# Patient Record
Sex: Male | Born: 1980 | Race: Black or African American | Hispanic: No | Marital: Married | Smoking: Current every day smoker
Health system: Southern US, Community
[De-identification: ages and names within clinical notes are randomized; demographics above are authoritative.]

## PROBLEM LIST (undated history)

## (undated) DIAGNOSIS — I10 Essential (primary) hypertension: Secondary | ICD-10-CM

## (undated) DIAGNOSIS — K219 Gastro-esophageal reflux disease without esophagitis: Secondary | ICD-10-CM

---

## 2009-10-27 ENCOUNTER — Emergency Department (HOSPITAL_COMMUNITY): Admission: EM | Admit: 2009-10-27 | Discharge: 2009-10-27 | Payer: Self-pay | Admitting: Emergency Medicine

## 2010-08-16 LAB — HEPATIC FUNCTION PANEL
ALT: 34 U/L (ref 0–53)
Alkaline Phosphatase: 42 U/L (ref 39–117)
Bilirubin, Direct: <0.1 mg/dL (ref 0.0–0.3)
Total Bilirubin: 1.1 mg/dL (ref 0.3–1.2)

## 2010-08-16 LAB — LIPASE, BLOOD: Lipase: 25 U/L (ref 11–59)

## 2010-08-16 LAB — RAPID URINE DRUG SCREEN, HOSP PERFORMED
Barbiturates: POSITIVE — AB
Opiates: NOT DETECTED
Tetrahydrocannabinol: POSITIVE — AB

## 2010-08-16 LAB — BASIC METABOLIC PANEL
CO2: 20 mEq/L (ref 19–32)
GFR calc Af Amer: >60 mL/min (ref >60)
Glucose, Bld: 132 mg/dL — ABNORMAL HIGH (ref 70–99)
Potassium: 3.6 mEq/L (ref 3.5–5.1)
Sodium: 137 mEq/L (ref 135–145)

## 2010-08-16 LAB — DIFFERENTIAL
Basophils Absolute: 0 10*3/uL (ref 0.0–0.1)
Basophils Relative: 0 % (ref 0–1)
Eosinophils Relative: 0 % (ref 0–5)
Lymphocytes Relative: 10 % — ABNORMAL LOW (ref 12–46)
Lymphs Abs: 1.1 10*3/uL (ref 0.7–4.0)
Monocytes Absolute: 0.4 10*3/uL (ref 0.1–1.0)
Monocytes Relative: 4 % (ref 3–12)
Neutro Abs: 9.3 10*3/uL — ABNORMAL HIGH (ref 1.7–7.7)

## 2010-08-16 LAB — CBC
RBC: 4.89 MIL/uL (ref 4.22–5.81)
WBC: 10.8 10*3/uL — ABNORMAL HIGH (ref 4.0–10.5)

## 2010-08-16 LAB — POCT CARDIAC MARKERS
Myoglobin, poc: 245 ng/mL (ref 12–200)
Troponin i, poc: <0.05 ng/mL (ref 0.00–0.09)

## 2010-10-31 ENCOUNTER — Emergency Department (HOSPITAL_COMMUNITY)
Admission: EM | Admit: 2010-10-31 | Discharge: 2010-10-31 | Disposition: A | Discharge status: Home / Self Care | Payer: BC Managed Care – PPO | Attending: Emergency Medicine | Admitting: Emergency Medicine

## 2010-10-31 ENCOUNTER — Emergency Department (HOSPITAL_COMMUNITY): Payer: BC Managed Care – PPO

## 2010-10-31 DIAGNOSIS — R0602 Shortness of breath: Secondary | ICD-10-CM | POA: Insufficient documentation

## 2010-10-31 DIAGNOSIS — F17200 Nicotine dependence, unspecified, uncomplicated: Secondary | ICD-10-CM | POA: Insufficient documentation

## 2010-10-31 DIAGNOSIS — K219 Gastro-esophageal reflux disease without esophagitis: Secondary | ICD-10-CM | POA: Insufficient documentation

## 2010-10-31 DIAGNOSIS — R079 Chest pain, unspecified: Secondary | ICD-10-CM | POA: Insufficient documentation

## 2011-02-14 ENCOUNTER — Emergency Department (HOSPITAL_COMMUNITY)
Admission: EM | Admit: 2011-02-14 | Discharge: 2011-02-14 | Disposition: A | Discharge status: Home / Self Care | Payer: BC Managed Care – PPO | Attending: Emergency Medicine | Admitting: Emergency Medicine

## 2011-02-14 ENCOUNTER — Emergency Department (HOSPITAL_COMMUNITY): Payer: BC Managed Care – PPO

## 2011-02-14 DIAGNOSIS — F411 Generalized anxiety disorder: Secondary | ICD-10-CM | POA: Insufficient documentation

## 2011-02-14 DIAGNOSIS — R079 Chest pain, unspecified: Secondary | ICD-10-CM | POA: Insufficient documentation

## 2011-02-14 DIAGNOSIS — I10 Essential (primary) hypertension: Secondary | ICD-10-CM | POA: Insufficient documentation

## 2011-02-14 LAB — COMPREHENSIVE METABOLIC PANEL
Albumin: 4.4 g/dL (ref 3.5–5.2)
Alkaline Phosphatase: 54 U/L (ref 39–117)
CO2: 22 mEq/L (ref 19–32)
GFR calc Af Amer: >60 mL/min (ref >60)
GFR calc non Af Amer: >60 mL/min (ref >60)
Potassium: 3.8 mEq/L (ref 3.5–5.1)
Sodium: 137 mEq/L (ref 135–145)
Total Bilirubin: 0.3 mg/dL (ref 0.3–1.2)
Total Protein: 7.9 g/dL (ref 6.0–8.3)

## 2011-02-14 LAB — CBC
HCT: 46.6 % (ref 39.0–52.0)
Hemoglobin: 16.1 g/dL (ref 13.0–17.0)
MCV: 94.1 fL (ref 78.0–100.0)
Platelets: 277 10*3/uL (ref 150–400)

## 2011-02-14 LAB — D-DIMER, QUANTITATIVE: D-Dimer, Quant: 0.27 ug/mL-FEU (ref 0.00–0.48)

## 2011-02-14 LAB — POCT I-STAT, CHEM 8
Creatinine, Ser: 0.7 mg/dL (ref 0.50–1.35)
Glucose, Bld: 106 mg/dL — ABNORMAL HIGH (ref 70–99)
HCT: 46 % (ref 39.0–52.0)
TCO2: 23 mmol/L (ref 0–100)

## 2011-02-14 LAB — DIFFERENTIAL
Basophils Absolute: 0.1 10*3/uL (ref 0.0–0.1)
Basophils Relative: 1 % (ref 0–1)
Lymphocytes Relative: 18 % (ref 12–46)
Lymphs Abs: 2.1 10*3/uL (ref 0.7–4.0)
Monocytes Absolute: 0.4 10*3/uL (ref 0.1–1.0)
Monocytes Relative: 4 % (ref 3–12)
Neutro Abs: 8.7 10*3/uL — ABNORMAL HIGH (ref 1.7–7.7)

## 2011-02-14 LAB — POCT I-STAT TROPONIN I

## 2011-02-21 ENCOUNTER — Emergency Department (HOSPITAL_COMMUNITY)
Admission: EM | Admit: 2011-02-21 | Discharge: 2011-02-21 | Disposition: A | Discharge status: Home / Self Care | Payer: BC Managed Care – PPO | Attending: Emergency Medicine | Admitting: Emergency Medicine

## 2011-02-21 ENCOUNTER — Emergency Department (HOSPITAL_COMMUNITY): Payer: BC Managed Care – PPO

## 2011-02-21 DIAGNOSIS — F17200 Nicotine dependence, unspecified, uncomplicated: Secondary | ICD-10-CM | POA: Insufficient documentation

## 2011-02-21 DIAGNOSIS — I10 Essential (primary) hypertension: Secondary | ICD-10-CM | POA: Insufficient documentation

## 2011-02-21 DIAGNOSIS — R079 Chest pain, unspecified: Secondary | ICD-10-CM | POA: Insufficient documentation

## 2011-02-21 DIAGNOSIS — I498 Other specified cardiac arrhythmias: Secondary | ICD-10-CM | POA: Insufficient documentation

## 2011-02-21 LAB — POCT I-STAT, CHEM 8
Calcium, Ion: 1.12 mmol/L (ref 1.12–1.32)
HCT: 50 % (ref 39.0–52.0)
Hemoglobin: 17 g/dL (ref 13.0–17.0)
Potassium: 3.6 mEq/L (ref 3.5–5.1)
Sodium: 139 mEq/L (ref 135–145)
TCO2: 20 mmol/L (ref 0–100)

## 2011-06-26 ENCOUNTER — Emergency Department (HOSPITAL_COMMUNITY)
Admission: EM | Admit: 2011-06-26 | Discharge: 2011-06-26 | Disposition: A | Discharge status: Home / Self Care | Payer: BC Managed Care – PPO | Attending: Emergency Medicine | Admitting: Emergency Medicine

## 2011-06-26 ENCOUNTER — Other Ambulatory Visit: Payer: Self-pay

## 2011-06-26 ENCOUNTER — Encounter (HOSPITAL_COMMUNITY): Payer: Self-pay | Admitting: Emergency Medicine

## 2011-06-26 ENCOUNTER — Emergency Department (HOSPITAL_COMMUNITY): Payer: BC Managed Care – PPO

## 2011-06-26 DIAGNOSIS — R079 Chest pain, unspecified: Secondary | ICD-10-CM | POA: Insufficient documentation

## 2011-06-26 DIAGNOSIS — K219 Gastro-esophageal reflux disease without esophagitis: Secondary | ICD-10-CM | POA: Insufficient documentation

## 2011-06-26 HISTORY — DX: Gastro-esophageal reflux disease without esophagitis: K21.9

## 2011-06-26 HISTORY — DX: Essential (primary) hypertension: I10

## 2011-06-26 LAB — CBC
HCT: 45.8 % (ref 39.0–52.0)
Hemoglobin: 16.1 g/dL (ref 13.0–17.0)
MCH: 33.1 pg (ref 26.0–34.0)
MCHC: 35.2 g/dL (ref 30.0–36.0)
RBC: 4.87 MIL/uL (ref 4.22–5.81)

## 2011-06-26 LAB — COMPREHENSIVE METABOLIC PANEL
Albumin: 4.4 g/dL (ref 3.5–5.2)
Alkaline Phosphatase: 47 U/L (ref 39–117)
BUN: 10 mg/dL (ref 6–23)
Chloride: 103 mEq/L (ref 96–112)
Creatinine, Ser: 0.86 mg/dL (ref 0.50–1.35)
GFR calc Af Amer: >90 mL/min (ref >90)
Glucose, Bld: 109 mg/dL — ABNORMAL HIGH (ref 70–99)
Potassium: 4.4 mEq/L (ref 3.5–5.1)
Total Bilirubin: 0.3 mg/dL (ref 0.3–1.2)

## 2011-06-26 LAB — DIFFERENTIAL
Basophils Relative: 0 % (ref 0–1)
Eosinophils Absolute: 0 10*3/uL (ref 0.0–0.7)
Lymphs Abs: 1 10*3/uL (ref 0.7–4.0)
Monocytes Absolute: 0.4 10*3/uL (ref 0.1–1.0)
Monocytes Relative: 5 % (ref 3–12)
Neutro Abs: 6.9 10*3/uL (ref 1.7–7.7)

## 2011-06-26 LAB — D-DIMER, QUANTITATIVE: D-Dimer, Quant: <0.22 ug/mL-FEU (ref 0.00–0.48)

## 2011-06-26 LAB — POCT I-STAT TROPONIN I: Troponin i, poc: 0 ng/mL (ref 0.00–0.08)

## 2011-06-26 MED ORDER — KETOROLAC TROMETHAMINE 30 MG/ML IJ SOLN
30.0000 mg | Freq: Once | INTRAMUSCULAR | Status: AC
  Administered 2011-06-26: 30 mg via INTRAVENOUS
  Filled 2011-06-26: qty 1

## 2011-06-26 MED ORDER — PROMETHAZINE HCL 25 MG PO TABS: 25.0000 mg | ORAL_TABLET | Freq: Four times a day (QID) | ORAL | Status: DC | PRN

## 2011-06-26 MED ORDER — ONDANSETRON HCL 4 MG/2ML IJ SOLN
4.0000 mg | INTRAMUSCULAR | Status: DC | PRN
  Administered 2011-06-26: 4 mg via INTRAVENOUS
  Filled 2011-06-26: qty 2

## 2011-06-26 MED ORDER — GI COCKTAIL ~~LOC~~
30.0000 mL | Freq: Once | ORAL | Status: AC
  Administered 2011-06-26: 30 mL via ORAL
  Filled 2011-06-26: qty 30

## 2011-06-26 MED ORDER — HYDROMORPHONE HCL PF 1 MG/ML IJ SOLN
1.0000 mg | Freq: Once | INTRAMUSCULAR | Status: AC
  Administered 2011-06-26: 1 mg via INTRAMUSCULAR
  Filled 2011-06-26: qty 1

## 2011-06-26 MED ORDER — HYDROCODONE-ACETAMINOPHEN 5-325 MG PO TABS: ORAL_TABLET | ORAL | Status: AC

## 2011-06-26 MED ORDER — FAMOTIDINE IN NACL 20-0.9 MG/50ML-% IV SOLN
20.0000 mg | Freq: Once | INTRAVENOUS | Status: AC
  Administered 2011-06-26: 20 mg via INTRAVENOUS
  Filled 2011-06-26: qty 50

## 2011-06-26 MED ORDER — SODIUM CHLORIDE 0.9 % IV SOLN
INTRAVENOUS | Status: DC
  Administered 2011-06-26: 100 mL via INTRAVENOUS

## 2011-06-26 MED ORDER — ONDANSETRON 8 MG PO TBDP
8.0000 mg | ORAL_TABLET | Freq: Once | ORAL | Status: AC
  Administered 2011-06-26: 8 mg via ORAL
  Filled 2011-06-26: qty 1

## 2011-06-26 NOTE — ED Notes (Signed)
Pt has been calling out for pain medicines, Dr.McManus notified and will come to talk to the patient.

## 2011-06-26 NOTE — ED Notes (Signed)
Pt c/o Left side chest pain, sharp, 10/10, onset at 0530. Worsen with respiration. No radiation of pain, do have nausea and vomiting. No SOB noted. Good oxygen saturation on room air. Breath sound clear.  ABC intact. HR 60 sinus rhyme noted

## 2011-06-26 NOTE — ED Notes (Signed)
Pt states he developed left side chest pain approx 0530 this am. States that he has had chest pain in the past was diagnosed with Acid Reflux. Pt states that this pain is different though. Pt reports nausea, vomiting, and diaphoreses with the chest pain. Pt states pain is located on left chest wall and does not move elsewhere.

## 2011-06-26 NOTE — ED Provider Notes (Signed)
History     CSN: [id number]  Arrival date & time 06/26/11  1207   Chief Complaint  Patient presents with  . Chest Pain   HPI Pt was seen at 1230.  Per pt, c/o gradual onset and persistence of constant lower mid-sternal chest "pain" since 0530 this morning.  Discomfort began while he was laying down.  Has been assoc with N/V.  Pt describes his symptoms today as per his previous hx of GERD, as dx by GI MD via EGD.  Pt has had multiple ED visits for same in the past several years, most recently 4 mos ago.  Denies diarrhea, no black or blood in stools, no SOB/cough, no back pain, no radiation of pain, no rash, no fevers.    Past Medical History  Diagnosis Date  . GERD (gastroesophageal reflux disease)   . Hypertension     No past surgical history on file.   History  Substance Use Topics  . Smoking status: Current Everyday Smoker  . Smokeless tobacco: Not on file  . Alcohol Use: Yes    Review of Systems ROS: Statement: All systems negative except as marked or noted in the HPI; Constitutional: Negative for fever and chills. ; ; Eyes: Negative for eye pain, redness and discharge. ; ; ENMT: Negative for ear pain, hoarseness, nasal congestion, sinus pressure and sore throat. ; ; Cardiovascular: +CP.  Negative for palpitations, diaphoresis, dyspnea and peripheral edema. ; ; Respiratory: Negative for cough, wheezing and stridor. ; ; Gastrointestinal: +N/V.  Negative for diarrhea, abdominal pain, blood in stool, hematemesis, jaundice and rectal bleeding. . ; ; Genitourinary: Negative for dysuria, flank pain and hematuria. ; ; Musculoskeletal: Negative for back pain and neck pain. Negative for swelling and trauma.; ; Skin: Negative for pruritus, rash, abrasions, blisters, bruising and skin lesion.; ; Neuro: Negative for headache, lightheadedness and neck stiffness. Negative for weakness, altered level of consciousness , altered mental status, extremity weakness, paresthesias, involuntary movement,  seizure and syncope.     Allergies  Review of patient's allergies indicates no known allergies.  Home Medications   Current Outpatient Rx  Name Route Sig Dispense Refill  . HYOSCYAMINE SULFATE 0.125 MG SL SUBL Sublingual Place 0.125 mg under the tongue every 4 (four) hours as needed. For stomach spasms.    Marland Kitchen PANTOPRAZOLE SODIUM 40 MG PO TBEC Oral Take 40 mg by mouth daily.    . DAYQUIL PO Oral Take 2 capsules by mouth every 6 (six) hours as needed. For cold symptoms.      BP 145/83  Temp(Src) 97.5 F (36.4 C) (Oral)  Resp 16  SpO2 100%  Physical Exam 1235: Physical examination:  Nursing notes reviewed; Vital signs and O2 SAT reviewed;  Constitutional: Well developed, Well nourished, Well hydrated, Uncomfortable appearing; Head:  Normocephalic, atraumatic; Eyes: EOMI, PERRL, No scleral icterus; ENMT: Mouth and pharynx normal, Mucous membranes moist; Neck: Supple, Full range of motion, No lymphadenopathy; Cardiovascular: Regular rate and rhythm, No murmur, rub, or gallop; Respiratory: Breath sounds clear & equal bilaterally, No rales, rhonchi, wheezes, or rub, Normal respiratory effort/excursion; Chest: Nontender, Movement normal; Abdomen: Soft, Nontender, Nondistended, Normal bowel sounds; Genitourinary: No CVA tenderness; Extremities: Pulses normal, No tenderness, No edema, No calf edema or asymmetry.; Neuro: AA&Ox3, Major CN grossly intact.  No gross focal motor or sensory deficits in extremities.; Skin: Color normal, Warm, Dry, no rash.    ED Course  Procedures   MDM  MDM Reviewed: nursing note, previous chart and vitals Reviewed previous: ECG  and labs Interpretation: labs, ECG and x-ray    Date: 06/26/2011  Rate: 57  Rhythm: normal sinus rhythm  QRS Axis: normal  Intervals: normal  ST/T Wave abnormalities: normal, artifact  Conduction Disutrbances:none  Narrative Interpretation:   Old EKG Reviewed: unchanged; no significant changes from previous EKG dated  02/21/2011.  Results for orders placed during the hospital encounter of 06/26/11  CBC      Component Value Range   WBC 8.3  4.0 - 10.5 (K/uL)   RBC 4.87  4.22 - 5.81 (MIL/uL)   Hemoglobin 16.1  13.0 - 17.0 (g/dL)   HCT 16.1  09.6 - 04.5 (%)   MCV 94.0  78.0 - 100.0 (fL)   MCH 33.1  26.0 - 34.0 (pg)   MCHC 35.2  30.0 - 36.0 (g/dL)   RDW 40.9  81.1 - 91.4 (%)   Platelets 233  150 - 400 (K/uL)  DIFFERENTIAL      Component Value Range   Neutrophils Relative 83 (*) 43 - 77 (%)   Neutro Abs 6.9  1.7 - 7.7 (K/uL)   Lymphocytes Relative 12  12 - 46 (%)   Lymphs Abs 1.0  0.7 - 4.0 (K/uL)   Monocytes Relative 5  3 - 12 (%)   Monocytes Absolute 0.4  0.1 - 1.0 (K/uL)   Eosinophils Relative 0  0 - 5 (%)   Eosinophils Absolute 0.0  0.0 - 0.7 (K/uL)   Basophils Relative 0  0 - 1 (%)   Basophils Absolute 0.0  0.0 - 0.1 (K/uL)  COMPREHENSIVE METABOLIC PANEL      Component Value Range   Sodium 138  135 - 145 (mEq/L)   Potassium 4.4  3.5 - 5.1 (mEq/L)   Chloride 103  96 - 112 (mEq/L)   CO2 29  19 - 32 (mEq/L)   Glucose, Bld 109 (*) 70 - 99 (mg/dL)   BUN 10  6 - 23 (mg/dL)   Creatinine, Ser 7.82  0.50 - 1.35 (mg/dL)   Calcium 9.8  8.4 - 95.6 (mg/dL)   Total Protein 7.5  6.0 - 8.3 (g/dL)   Albumin 4.4  3.5 - 5.2 (g/dL)   AST 17  0 - 37 (U/L)   ALT 24  0 - 53 (U/L)   Alkaline Phosphatase 47  39 - 117 (U/L)   Total Bilirubin 0.3  0.3 - 1.2 (mg/dL)   GFR calc non Af Amer >90  >90 (mL/min)   GFR calc Af Amer >90  >90 (mL/min)  D-DIMER, QUANTITATIVE      Component Value Range   D-Dimer, Quant <0.22  0.00 - 0.48 (ug/mL-FEU)  POCT I-STAT TROPONIN I      Component Value Range   Troponin i, poc 0.00  0.00 - 0.08 (ng/mL)   Comment 3             Dg Chest 2 View 06/26/2011  *RADIOLOGY REPORT*  Clinical Data: Rule out infiltrate.  Look for free air.  CHEST - 2 VIEW  Comparison: 02/21/2011  Findings: Heart size appears normal.  No pleural effusion or pulmonary edema.  No airspace consolidation.   Review of the visualized osseous structures is normal.  IMPRESSION:  1.  No active cardiopulmonary abnormalities.  Original Report Authenticated By: Rosealee Albee, M.D.     1505:   Pt requesting "some more medicine" for pain and nausea.  Pt requesting "that medicine they gave me last time I was here. That 'd' medicine."  (IV Dilaudid on EPIC  chart review.)  Doubt PE at this time given neg d-dimer, doubt ACS with unchanged/normal EKG from previous and normal troponin after 8+ hours of symptoms.  Has been eval in ED previously for same, dx chest wall pain (reported lifting boxes at work), as well as GERD.  Pt's wife states he "had that camera put down into his stomach" and "was told everything was ok" so he was started on PPI and bentyl.  Pt's VSS/afebrile today, no abd pain, no radiation of pain, no back pain.  Doubt need for CT scan at this time, as labs and CXR are normal.  Dx testing d/w pt and family.  Questions answered.  Verb understanding, agreeable to d/c home with outpt f/u.  Encouraged to f/u with is PMD and GI MD.  Verb understanding.         Keamber Macfadden Allison Quarry, DO 06/28/11 1154

## 2011-06-26 NOTE — ED Notes (Signed)
EDP went to room, assessed the patient.

## 2015-03-14 ENCOUNTER — Encounter (HOSPITAL_COMMUNITY): Payer: Self-pay | Admitting: Emergency Medicine

## 2015-03-14 ENCOUNTER — Emergency Department (HOSPITAL_COMMUNITY): Payer: Self-pay

## 2015-03-14 ENCOUNTER — Emergency Department (HOSPITAL_COMMUNITY)
Admission: EM | Admit: 2015-03-14 | Discharge: 2015-03-14 | Disposition: A | Discharge status: Home / Self Care | Payer: Self-pay | Attending: Emergency Medicine | Admitting: Emergency Medicine

## 2015-03-14 DIAGNOSIS — Z720 Tobacco use: Secondary | ICD-10-CM | POA: Insufficient documentation

## 2015-03-14 DIAGNOSIS — R079 Chest pain, unspecified: Secondary | ICD-10-CM | POA: Insufficient documentation

## 2015-03-14 DIAGNOSIS — I10 Essential (primary) hypertension: Secondary | ICD-10-CM | POA: Insufficient documentation

## 2015-03-14 DIAGNOSIS — K219 Gastro-esophageal reflux disease without esophagitis: Secondary | ICD-10-CM | POA: Insufficient documentation

## 2015-03-14 LAB — CBC WITH DIFFERENTIAL/PLATELET
BASOS ABS: 0 10*3/uL (ref 0.0–0.1)
BASOS PCT: 0 %
EOS PCT: 1 %
Eosinophils Absolute: 0.2 10*3/uL (ref 0.0–0.7)
HEMATOCRIT: 49 % (ref 39.0–52.0)
Hemoglobin: 17.2 g/dL — ABNORMAL HIGH (ref 13.0–17.0)
Lymphocytes Relative: 13 %
Lymphs Abs: 1.5 10*3/uL (ref 0.7–4.0)
MCH: 33.8 pg (ref 26.0–34.0)
MCHC: 35.1 g/dL (ref 30.0–36.0)
MCV: 96.3 fL (ref 78.0–100.0)
MONO ABS: 0.5 10*3/uL (ref 0.1–1.0)
Monocytes Relative: 5 %
Neutro Abs: 9.4 10*3/uL — ABNORMAL HIGH (ref 1.7–7.7)
Neutrophils Relative %: 81 %
Platelets: 258 10*3/uL (ref 150–400)
RBC: 5.09 MIL/uL (ref 4.22–5.81)
RDW: 13 % (ref 11.5–15.5)
WBC: 11.6 10*3/uL — ABNORMAL HIGH (ref 4.0–10.5)

## 2015-03-14 LAB — BASIC METABOLIC PANEL
Anion gap: 9 (ref 5–15)
BUN: 11 mg/dL (ref 6–20)
CHLORIDE: 103 mmol/L (ref 101–111)
CO2: 27 mmol/L (ref 22–32)
Calcium: 9.8 mg/dL (ref 8.9–10.3)
Creatinine, Ser: 1.02 mg/dL (ref 0.61–1.24)
GFR calc Af Amer: >60 mL/min (ref >60)
GFR calc non Af Amer: >60 mL/min (ref >60)
Glucose, Bld: 129 mg/dL — ABNORMAL HIGH (ref 65–99)
POTASSIUM: 4.1 mmol/L (ref 3.5–5.1)
SODIUM: 139 mmol/L (ref 135–145)

## 2015-03-14 LAB — I-STAT TROPONIN, ED: TROPONIN I, POC: 0 ng/mL (ref 0.00–0.08)

## 2015-03-14 MED ORDER — ONDANSETRON HCL 4 MG/2ML IJ SOLN
4.0000 mg | Freq: Once | INTRAMUSCULAR | Status: AC
  Administered 2015-03-14: 4 mg via INTRAVENOUS
  Filled 2015-03-14: qty 2

## 2015-03-14 MED ORDER — PANTOPRAZOLE SODIUM 20 MG PO TBEC: 20.0000 mg | DELAYED_RELEASE_TABLET | Freq: Every day | ORAL | Status: DC

## 2015-03-14 MED ORDER — GI COCKTAIL ~~LOC~~
30.0000 mL | Freq: Once | ORAL | Status: AC
  Administered 2015-03-14: 30 mL via ORAL
  Filled 2015-03-14: qty 30

## 2015-03-14 MED ORDER — FAMOTIDINE IN NACL 20-0.9 MG/50ML-% IV SOLN
20.0000 mg | Freq: Once | INTRAVENOUS | Status: AC
  Administered 2015-03-14: 20 mg via INTRAVENOUS
  Filled 2015-03-14: qty 50

## 2015-03-14 MED ORDER — HYDROCODONE-ACETAMINOPHEN 5-325 MG PO TABS
1.0000 | ORAL_TABLET | Freq: Once | ORAL | Status: AC
  Administered 2015-03-14: 1 via ORAL
  Filled 2015-03-14: qty 1

## 2015-03-14 NOTE — ED Notes (Signed)
He tells me his "chest is hurting" and that it began this morning and that it is new.  He denies drinking/smoking or drug use.  He is writhing as if in much pain and is minimally verbal.  EKG performed at triage.

## 2015-03-14 NOTE — ED Notes (Signed)
Patient transported to X-ray 

## 2015-03-14 NOTE — ED Notes (Signed)
Patient  States that he was awakened from his sleep with pressure and sharp pains to his midchest. It is only relieved when he holds his breath. He denies any issues prior to going to bed. He denies any drug use. Patient has had one episode similar before but does not remember what the diagnosis was.

## 2015-03-14 NOTE — ED Provider Notes (Signed)
CSN: [id number]     Arrival date & time 03/14/15  [id number] History   First MD Initiated Contact with Patient 03/14/15 0804     Chief Complaint  Patient presents with  . Chest Pain     (Consider location/radiation/quality/duration/timing/severity/associated sxs/prior Treatment) HPI 34 year old male with history of GERD who presents the emergency department with chest pain. Says that he woke at 3 AM this morning with retrosternal chest pressure and pain. Says he has had similar symptoms as this a few years ago, which she does not recall what it was due to. Pain is not radiating. Denies any associating shortness of breath, diaphoresis, syncope or near syncope, cough, fever, or other recent URI symptoms. There is associating nausea, but denies vomiting, abdominal pain, diarrhea, or urinary complaints. Reports that pain does seem to worsen with deep breaths, denies that pain is worse with exertion. Denies that pain is reproducible. Reports having [employer] last night to eat just prior to going to bed. Denies leg swelling, leg pain, h/o or family h/o DVT/PE, or recent immobolization.    Past Medical History  Diagnosis Date  . GERD (gastroesophageal reflux disease)   . Hypertension    History reviewed. No pertinent past surgical history. History reviewed. No pertinent family history. Social History  Substance Use Topics  . Smoking status: Current Every Day Smoker  . Smokeless tobacco: None  . Alcohol Use: Yes    Review of Systems 10/14 systems reviewed and are negative other than those stated in the HPI    Allergies  Review of patient's allergies indicates no known allergies.  Home Medications   Prior to Admission medications   Medication Sig Start Date End Date Taking? Authorizing Provider  pantoprazole (PROTONIX) 20 MG tablet Take 1 tablet (20 mg total) by mouth daily. 03/14/15   Lavera Guiseana Duo Rumaysa Sabatino, MD   BP 131/91 mmHg  Pulse 70  Temp(Src) 98.1 F (36.7 C) (Oral)  Resp 21  SpO2  100% Physical Exam Physical Exam  Nursing note and vitals reviewed. Constitutional: Well developed, well nourished, non-toxic Head: Normocephalic and atraumatic.  Mouth/Throat: Oropharynx is clear and moist.  Neck: Normal range of motion. Neck supple.  Cardiovascular: Normal rate and regular rhythm.  No edema. +2 symmetric radial pulse Pulmonary/Chest: Effort normal and breath sounds normal.  Abdominal: Soft. There is no tenderness. There is no rebound and no guarding.  Musculoskeletal: Normal range of motion.  Neurological: Alert, no facial droop, fluent speech, moves all extremities symmetrically Skin: Skin is warm and dry.  Psychiatric: Cooperative   ED Course  Procedures (including critical care time) Labs Review Labs Reviewed  CBC WITH DIFFERENTIAL/PLATELET - Abnormal; Notable for the following:    WBC 11.6 (*)    Hemoglobin 17.2 (*)    Neutro Abs 9.4 (*)    All other components within normal limits  BASIC METABOLIC PANEL - Abnormal; Notable for the following:    Glucose, Bld 129 (*)    All other components within normal limits  I-STAT TROPOININ, ED    Imaging Review Dg Chest 2 View  03/14/2015  CLINICAL DATA:  Mid chest pain x6 hours EXAM: CHEST  2 VIEW COMPARISON:  11/06/2012 FINDINGS: Lungs are clear.  No pleural effusion or pneumothorax. The heart is normal in size. Visualized osseous structures are within normal limits. IMPRESSION: Normal chest radiographs. Electronically Signed   By: Charline BillsSriyesh  Krishnan M.D.   On: 03/14/2015 09:10   I have personally reviewed and evaluated these images and lab results as part  of my medical decision-making.   EKG Interpretation   Date/Time:  Saturday March 14 2015 08:03:04 EDT Ventricular Rate:  83 PR Interval:  150 QRS Duration: 75 QT Interval:  358 QTC Calculation: 421 R Axis:   86 Text Interpretation:  Sinus rhythm Consider right atrial enlargement  Benign early repolarization No significant change since last tracing   Confirmed by Minervia Osso MD, Ladonya Jerkins ([id number]) on 03/14/2015 8:08:36 AM      MDM   Final diagnoses:  Chest pain, unspecified chest pain type  Gastroesophageal reflux disease, esophagitis presence not specified    34 year old male with history of GERD who presents with chest pain. Prior to my evaluation of patient, the nurses in triage tech reports that he was writhing about in bed, yelling for pain medication. On my evaluation of this patient, I he is nontoxic in appearance. States that he needs pain medication for his chest pain. His vital signs are within normal limits, and exam is otherwise unremarkable. EKG showing no ischemic changes and unchanged from prior. Troponin x 1 negative, and nature of pain does not seem ACS related. Especially given ongoing pain for 6 hours with negative troponin. On chart review, patient has EGD diagnosis of GERD, but states that he self discontinued his pantoprazole. He has had prior episodes of chest pain which she has been seen in the ED before similar in nature associated with GERD. PERC negative and felt ruled out for PE. Heart score 2 for tobacco use and possible HTN, and at low risk for MACE. Given GI cocktail, pepcid, and zofran in ED with some improvement. Restarted patient's home protonix. Strict return and follow-up instructions reviewed. He expressed understanding of all discharge instructions and felt comfortable with the plan of care.   Lavera Guise, MD 03/14/15 [phone]

## 2015-03-14 NOTE — ED Notes (Signed)
MD at bedside. 

## 2015-03-14 NOTE — Discharge Instructions (Signed)
Please start taking your protonix again. Return for worsening symptoms, including worsening pain, difficulty breathing, passing out, blooy or black tarry vomit or stools, or any other symptoms concerning to you. Nonspecific Chest Pain  Chest pain can be caused by many different conditions. There is always a chance that your pain could be related to something serious, such as a heart attack or a blood clot in your lungs. Chest pain can also be caused by conditions that are not life-threatening. If you have chest pain, it is very important to follow up with your health care provider. CAUSES  Chest pain can be caused by:  Heartburn.  Pneumonia or bronchitis.  Anxiety or stress.  Inflammation around your heart (pericarditis) or lung (pleuritis or pleurisy).  A blood clot in your lung.  A collapsed lung (pneumothorax). It can develop suddenly on its own (spontaneous pneumothorax) or from trauma to the chest.  Shingles infection (varicella-zoster virus).  Heart attack.  Damage to the bones, muscles, and cartilage that make up your chest wall. This can include:  Bruised bones due to injury.  Strained muscles or cartilage due to frequent or repeated coughing or overwork.  Fracture to one or more ribs.  Sore cartilage due to inflammation (costochondritis). RISK FACTORS  Risk factors for chest pain may include:  Activities that increase your risk for trauma or injury to your chest.  Respiratory infections or conditions that cause frequent coughing.  Medical conditions or overeating that can cause heartburn.  Heart disease or family history of heart disease.  Conditions or health behaviors that increase your risk of developing a blood clot.  Having had chicken pox (varicella zoster). SIGNS AND SYMPTOMS Chest pain can feel like:  Burning or tingling on the surface of your chest or deep in your chest.  Crushing, pressure, aching, or squeezing pain.  Dull or sharp pain that is  worse when you move, cough, or take a deep breath.  Pain that is also felt in your back, neck, shoulder, or arm, or pain that spreads to any of these areas. Your chest pain may come and go, or it may stay constant. DIAGNOSIS Lab tests or other studies may be needed to find the cause of your pain. Your health care provider may have you take a test called an ambulatory ECG (electrocardiogram). An ECG records your heartbeat patterns at the time the test is performed. You may also have other tests, such as:  Transthoracic echocardiogram (TTE). During echocardiography, sound waves are used to create a picture of all of the heart structures and to look at how blood flows through your heart.  Transesophageal echocardiogram (TEE).This is a more advanced imaging test that obtains images from inside your body. It allows your health care provider to see your heart in finer detail.  Cardiac monitoring. This allows your health care provider to monitor your heart rate and rhythm in real time.  Holter monitor. This is a portable device that records your heartbeat and can help to diagnose abnormal heartbeats. It allows your health care provider to track your heart activity for several days, if needed.  Stress tests. These can be done through exercise or by taking medicine that makes your heart beat more quickly.  Blood tests.  Imaging tests. TREATMENT  Your treatment depends on what is causing your chest pain. Treatment may include:  Medicines. These may include:  Acid blockers for heartburn.  Anti-inflammatory medicine.  Pain medicine for inflammatory conditions.  Antibiotic medicine, if an infection is present.  Medicines to dissolve blood clots.  Medicines to treat coronary artery disease.  Supportive care for conditions that do not require medicines. This may include:  Resting.  Applying heat or cold packs to injured areas.  Limiting activities until pain decreases. HOME CARE  INSTRUCTIONS  If you were prescribed an antibiotic medicine, finish it all even if you start to feel better.  Avoid any activities that bring on chest pain.  Do not use any tobacco products, including cigarettes, chewing tobacco, or electronic cigarettes. If you need help quitting, ask your health care provider.  Do not drink alcohol.  Take medicines only as directed by your health care provider.  Keep all follow-up visits as directed by your health care provider. This is important. This includes any further testing if your chest pain does not go away.  If heartburn is the cause for your chest pain, you may be told to keep your head raised (elevated) while sleeping. This reduces the chance that acid will go from your stomach into your esophagus.  Make lifestyle changes as directed by your health care provider. These may include:  Getting regular exercise. Ask your health care provider to suggest some activities that are safe for you.  Eating a heart-healthy diet. A registered dietitian can help you to learn healthy eating options.  Maintaining a healthy weight.  Managing diabetes, if necessary.  Reducing stress. SEEK MEDICAL CARE IF:  Your chest pain does not go away after treatment.  You have a rash with blisters on your chest.  You have a fever. SEEK IMMEDIATE MEDICAL CARE IF:   Your chest pain is worse.  You have an increasing cough, or you cough up blood.  You have severe abdominal pain.  You have severe weakness.  You faint.  You have chills.  You have sudden, unexplained chest discomfort.  You have sudden, unexplained discomfort in your arms, back, neck, or jaw.  You have shortness of breath at any time.  You suddenly start to sweat, or your skin gets clammy.  You feel nauseous or you vomit.  You suddenly feel light-headed or dizzy.  Your heart begins to beat quickly, or it feels like it is skipping beats. These symptoms may represent a serious  problem that is an emergency. Do not wait to see if the symptoms will go away. Get medical help right away. Call your local emergency services (911 in the [place] Do not drive yourself to the hospital.   This information is not intended to replace advice given to you by your health care provider. Make sure you discuss any questions you have with your health care provider.   Document Released: 02/23/2005 Document Revised: 06/06/2014 Document Reviewed: 12/20/2013 Elsevier Interactive Patient Education 2016 Else[employer] Inc[employer]   Gastroesophageal Reflux Disease, Adult Normally, food travels down the esophagus and stays in the stomach to be digested. If a person has gastroesophageal reflux disease (GERD), food and stomach acid move back up into the esophagus. When this happens, the esophagus becomes sore and swollen (inflamed). Over time, GERD can make small holes (ulcers) in the lining of the esophagus. HOME CARE Diet  Follow a diet as told by your doctor. You may need to avoid foods and drinks such as:  Coffee and tea (with or without caffeine).  Drinks that contain alcohol.  Energy drinks and sports drinks.  Carbonated drinks or sodas.  Chocolate and cocoa.  Peppermint and mint flavorings.  Garlic and onions.  Horseradish.  Spicy and acidic foods, such as  peppers, chili powder, curry powder, vinegar, hot sauces, and BBQ sauce.  Citrus fruit juices and citrus fruits, such as oranges, lemons, and limes.  Tomato-based foods, such as red sauce, chili, salsa, and pizza with red sauce.  Fried and fatty foods, such as donuts, french fries, potato chips, and high-fat dressings.  High-fat meats, such as hot dogs, rib eye steak, sausage, ham, and bacon.  High-fat dairy items, such as whole milk, butter, and cream cheese.  Eat small meals often. Avoid eating large meals.  Avoid drinking large amounts of liquid with your meals.  Avoid eating meals during the 2-3 hours before  bedtime.  Avoid lying down right after you eat.  Do not exercise right after you eat. General Instructions  Pay attention to any changes in your symptoms.  Take over-the-counter and prescription medicines only as told by your doctor. Do not take aspirin, ibuprofen, or other NSAIDs unless your doctor says it is okay.  Do not use any tobacco products, including cigarettes, chewing tobacco, and e-cigarettes. If you need help quitting, ask your doctor.  Wear loose clothes. Do not wear anything tight around your waist.  Raise (elevate) the head of your bed about 6 inches (15 cm).  Try to lower your stress. If you need help doing this, ask your doctor.  If you are overweight, lose an amount of weight that is healthy for you. Ask your doctor about a safe weight loss goal.  Keep all follow-up visits as told by your doctor. This is important. GET HELP IF:  You have new symptoms.  You lose weight and you do not know why it is happening.  You have trouble swallowing, or it hurts to swallow.  You have wheezing or a cough that keeps happening.  Your symptoms do not get better with treatment.  You have a hoarse voice. GET HELP RIGHT AWAY IF:  You have pain in your arms, neck, jaw, teeth, or back.  You feel sweaty, dizzy, or light-headed.  You have chest pain or shortness of breath.  You throw up (vomit) and your throw up looks like blood or coffee grounds.  You pass out (faint).  Your poop (stool) is bloody or black.  You cannot swallow, drink, or eat.   This information is not intended to replace advice given to you by your health care provider. Make sure you discuss any questions you have with your health care provider.   Document Released: 11/02/2007 Document Revised: 02/04/2015 Document Reviewed: 09/10/2014 Elsevier Interactive Patient Education 2016 Elsevier Inc.   Heartburn Heartburn is a type of pain or discomfort that can happen in the throat or chest. It is  often described as a burning pain. It may also cause a bad taste in the mouth. Heartburn may feel worse when you lie down or bend over. It may be caused by stomach contents that move back up (reflux) into the tube that connects the mouth with the stomach (esophagus). HOME CARE Take these actions to lessen your discomfort and to help avoid problems. Diet  Follow a diet as told by your doctor. You may need to avoid foods and drinks such as:  Coffee and tea (with or without caffeine).  Drinks that contain alcohol.  Energy drinks and sports drinks.  Carbonated drinks or sodas.  Chocolate and cocoa.  Peppermint and mint flavorings.  Garlic and onions.  Horseradish.  Spicy and acidic foods, such as peppers, chili powder, curry powder, vinegar, hot sauces, and BBQ sauce.  Citrus fruit  juices and citrus fruits, such as oranges, lemons, and limes.  Tomato-based foods, such as red sauce, chili, salsa, and pizza with red sauce.  Fried and fatty foods, such as donuts, french fries, potato chips, and high-fat dressings.  High-fat meats, such as hot dogs, rib eye steak, sausage, ham, and bacon.  High-fat dairy items, such as whole milk, butter, and cream cheese.  Eat small meals often. Avoid eating large meals.  Avoid drinking large amounts of liquid with your meals.  Avoid eating meals during the 2-3 hours before bedtime.  Avoid lying down right after you eat.  Do not exercise right after you eat. General Instructions  Pay attention to any changes in your symptoms.  Take over-the-counter and prescription medicines only as told by your doctor. Do not take aspirin, ibuprofen, or other NSAIDs unless your doctor says it is okay.  Do not use any tobacco products, including cigarettes, chewing tobacco, and e-cigarettes. If you need help quitting, ask your doctor.  Wear loose clothes. Do not wear anything tight around your waist.  Raise (elevate) the head of your bed about 6 inches  (15 cm).  Try to lower your stress. If you need help doing this, ask your doctor.  If you are overweight, lose an amount of weight that is healthy for you. Ask your doctor about a safe weight loss goal.  Keep all follow-up visits as told by your doctor. This is important. GET HELP IF:  You have new symptoms.  You lose weight and you do not know why it is happening.  You have trouble swallowing, or it hurts to swallow.  You have wheezing or a cough that keeps happening.  Your symptoms do not get better with treatment.  You have heartburn often for more than two weeks. GET HELP RIGHT AWAY IF:  You have pain in your arms, neck, jaw, teeth, or back.  You feel sweaty, dizzy, or light-headed.  You have chest pain or shortness of breath.  You throw up (vomit) and your throw up looks like blood or coffee grounds.  Your poop (stool) is bloody or black.   This information is not intended to replace advice given to you by your health care provider. Make sure you discuss any questions you have with your health care provider.   Document Released: 01/26/2011 Document Revised: 02/04/2015 Document Reviewed: 09/10/2014 Elsevier Interactive Patient Education [employer]

## 2015-03-16 ENCOUNTER — Emergency Department (HOSPITAL_COMMUNITY): Payer: Self-pay

## 2015-03-16 ENCOUNTER — Emergency Department (HOSPITAL_COMMUNITY)
Admission: EM | Admit: 2015-03-16 | Discharge: 2015-03-16 | Disposition: A | Discharge status: Home / Self Care | Payer: Self-pay | Attending: Emergency Medicine | Admitting: Emergency Medicine

## 2015-03-16 ENCOUNTER — Encounter (HOSPITAL_COMMUNITY): Payer: Self-pay | Admitting: General Practice

## 2015-03-16 DIAGNOSIS — K219 Gastro-esophageal reflux disease without esophagitis: Secondary | ICD-10-CM | POA: Insufficient documentation

## 2015-03-16 DIAGNOSIS — Z720 Tobacco use: Secondary | ICD-10-CM | POA: Insufficient documentation

## 2015-03-16 DIAGNOSIS — I10 Essential (primary) hypertension: Secondary | ICD-10-CM | POA: Insufficient documentation

## 2015-03-16 DIAGNOSIS — R079 Chest pain, unspecified: Secondary | ICD-10-CM | POA: Insufficient documentation

## 2015-03-16 DIAGNOSIS — R1013 Epigastric pain: Secondary | ICD-10-CM

## 2015-03-16 LAB — CBC
HCT: 47.5 % (ref 39.0–52.0)
Hemoglobin: 16.7 g/dL (ref 13.0–17.0)
MCH: 33.1 pg (ref 26.0–34.0)
MCHC: 35.2 g/dL (ref 30.0–36.0)
MCV: 94.1 fL (ref 78.0–100.0)
Platelets: 294 10*3/uL (ref 150–400)
RBC: 5.05 MIL/uL (ref 4.22–5.81)
RDW: 12.4 % (ref 11.5–15.5)
WBC: 10.5 10*3/uL (ref 4.0–10.5)

## 2015-03-16 LAB — BASIC METABOLIC PANEL
ANION GAP: 15 (ref 5–15)
BUN: 12 mg/dL (ref 6–20)
CALCIUM: 9.8 mg/dL (ref 8.9–10.3)
CO2: 23 mmol/L (ref 22–32)
Chloride: 101 mmol/L (ref 101–111)
Creatinine, Ser: 1.08 mg/dL (ref 0.61–1.24)
GFR calc Af Amer: >60 mL/min (ref >60)
GLUCOSE: 143 mg/dL — AB (ref 65–99)
Potassium: 3.5 mmol/L (ref 3.5–5.1)
Sodium: 139 mmol/L (ref 135–145)

## 2015-03-16 LAB — I-STAT TROPONIN, ED: Troponin i, poc: 0 ng/mL (ref 0.00–0.08)

## 2015-03-16 LAB — HEPATIC FUNCTION PANEL
ALT: 29 U/L (ref 17–63)
AST: 38 U/L (ref 15–41)
Albumin: 4.6 g/dL (ref 3.5–5.0)
Alkaline Phosphatase: 41 U/L (ref 38–126)
BILIRUBIN DIRECT: 0.2 mg/dL (ref 0.1–0.5)
BILIRUBIN INDIRECT: 0.8 mg/dL (ref 0.3–0.9)
Total Bilirubin: 1 mg/dL (ref 0.3–1.2)
Total Protein: 7.9 g/dL (ref 6.5–8.1)

## 2015-03-16 LAB — LIPASE, BLOOD: Lipase: 20 U/L — ABNORMAL LOW (ref 22–51)

## 2015-03-16 MED ORDER — PANTOPRAZOLE SODIUM 40 MG IV SOLR
40.0000 mg | INTRAVENOUS | Status: AC
  Administered 2015-03-16: 40 mg via INTRAVENOUS
  Filled 2015-03-16: qty 40

## 2015-03-16 MED ORDER — METOCLOPRAMIDE HCL 5 MG/ML IJ SOLN
10.0000 mg | Freq: Once | INTRAMUSCULAR | Status: AC
  Administered 2015-03-16: 10 mg via INTRAVENOUS
  Filled 2015-03-16: qty 2

## 2015-03-16 MED ORDER — ONDANSETRON 4 MG PO TBDP: 4.0000 mg | ORAL_TABLET | Freq: Three times a day (TID) | ORAL | Status: DC | PRN

## 2015-03-16 MED ORDER — MORPHINE SULFATE (PF) 4 MG/ML IV SOLN
4.0000 mg | Freq: Once | INTRAVENOUS | Status: AC
  Administered 2015-03-16: 4 mg via INTRAVENOUS
  Filled 2015-03-16: qty 1

## 2015-03-16 NOTE — ED Provider Notes (Signed)
CSN: [id number]     Arrival date & time 03/16/15  1227 History   First MD Initiated Contact with Patient 03/16/15 1232     Chief Complaint  Patient presents with  . Chest Pain  . Abdominal Pain     (Consider location/radiation/quality/duration/timing/severity/associated sxs/prior Treatment) HPI Comments: Pt is a 34 yo male with hx of GERD who presents to the ED with complaint of midsternal/epigastric CP. Pt reports the CP started Saturday morning, was seen in the ED, cardiac workup negative, mild improvement with GI cocktail, pepcid and zofran in ED, d/c home with protonix. Pt reports the pain improved on Sunday and this morning when he woke up he had worsening pain resulting in him going to his PCP. PCP gave pt zofran and GI cocktail in office without relief, resulting in pt being sent to ED via EMS. EMS administered ASA and 2 nitro without relief. Pt reports having N/V today. Denies aggravating or alleviating factors. Denies fever, chills, HA, cough, SOB, palpitations, diaphoresis, URI sxs, diarrhea, urinary sxs, leg swelling. Pt reports he has not been taking his pantoprazole. Pt endorses drinking EtOH frequently.    Past Medical History  Diagnosis Date  . GERD (gastroesophageal reflux disease)   . Hypertension    History reviewed. No pertinent past surgical history. No family history on file. Social History  Substance Use Topics  . Smoking status: Current Every Day Smoker -- 1.00 packs/day    Types: Cigarettes  . Smokeless tobacco: None  . Alcohol Use: Yes    Review of Systems  Cardiovascular: Positive for chest pain.  Gastrointestinal: Positive for nausea, vomiting and abdominal pain.  All other systems reviewed and are negative.     Allergies  Review of patient's allergies indicates no known allergies.  Home Medications   Prior to Admission medications   Medication Sig Start Date End Date Taking? Authorizing Provider  pantoprazole (PROTONIX) 20 MG tablet Take 1  tablet (20 mg total) by mouth daily. 03/14/15   Lavera Guise, MD   BP 142/75 mmHg  Pulse 104  Temp(Src) 98.2 F (36.8 C) (Oral)  Resp 27  Ht  (1.727 m)  Wt 179 lb (81.194 kg)  BMI 27.22 kg/m2  SpO2 100% Physical Exam  Constitutional: He is oriented to person, place, and time. He appears well-developed and well-nourished.  HENT:  Head: Normocephalic and atraumatic.  Mouth/Throat: Oropharynx is clear and moist. No oropharyngeal exudate.  Eyes: Conjunctivae and EOM are normal. Right eye exhibits no discharge. Left eye exhibits no discharge. No scleral icterus.  Neck: Normal range of motion. Neck supple.  Cardiovascular: Normal rate, regular rhythm, normal heart sounds and intact distal pulses.  Exam reveals no gallop and no friction rub.   No murmur heard. Pulmonary/Chest: Effort normal and breath sounds normal. No respiratory distress. He has no wheezes. He has no rales. He exhibits tenderness (lower mid-sternal chest wall mildly TTP).  Abdominal: Soft. Bowel sounds are normal. He exhibits no distension and no mass. There is no tenderness. There is no rebound and no guarding.  Musculoskeletal: Normal range of motion. He exhibits no edema.  Neurological: He is alert and oriented to person, place, and time.  Skin: Skin is warm and dry.  Nursing note and vitals reviewed.   ED Course  Procedures (including critical care time) Labs Review Labs Reviewed  BASIC METABOLIC PANEL  CBC    Imaging Review No results found. I have personally reviewed and evaluated these images and lab results as part of  my medical decision-making.  Filed Vitals:   03/16/15 1500  BP: 122/68  Pulse: 59  Temp:   Resp: 19    Meds given in ED:  Medications  metoCLOPramide (REGLAN) injection 10 mg (10 mg Intravenous Given 03/16/15 1322)  pantoprazole (PROTONIX) injection 40 mg (40 mg Intravenous Given 03/16/15 1328)  morphine 4 MG/ML injection 4 mg (4 mg Intravenous Given 03/16/15 1358)     Discharge Medication List as of 03/16/2015  2:28 PM    START taking these medications   Details  ondansetron (ZOFRAN ODT) 4 MG disintegrating tablet Take 1 tablet (4 mg total) by mouth every 8 (eight) hours as needed for nausea or vomiting., Starting 03/16/2015, Until Discontinued, Print         MDM   Final diagnoses:  Epigastric pain  Gastroesophageal reflux disease, esophagitis presence not specified    Pt presents with worsening CP/epigastric pain, N/V. Hx of GERD, currently not on any medication. He was seen in the ED 3 days ago for same pain, negative cardiac workup, dx with GERD and given rx for protonix. Pt reports never filling the rx due to cost of medication. Pt seen by his PCP this morning and sent to ED due to no relief with zofran, GI cocktail and pepcid. VSS. Exam revealed mild TTP at lower midsternal/epigastric region. Pt given pain meds and antiemetics.   Labs unremarkable. Trop negative. EKG showed NSR with prolonged QT. Repeat EKG ordered. Second EKG showed NSR, normal QT interval. Pt reports his pain has improved. I suspect pain is likely due to GERD exacerbation, pt endorses drinking EtOH this weekend and notes he consumes EtOH weekly. Plan to d/c pt home with GI follow up. Pt advised to get his rx filled for protonix or advised to continue taking Prilosec OTC, pt also given zofran rx and advised to refrain from drinking EtOH.   Evaluation does not show pathology requring ongoing emergent intervention or admission. Pt is hemodynamically stable and mentating appropriately. Discussed findings/results and plan with patient/guardian, who agrees with plan. All questions answered. Return precautions discussed and outpatient follow up given.    Satira Sarkicole Elizabeth SnookNadeau, [place] JerseyPA-C 03/16/15 1551  Blane OharaJoshua Zavitz, MD 03/17/15 [phone]

## 2015-03-16 NOTE — ED Notes (Signed)
Pt presents via [facility] with complaints of abdominal and chest pain that started this morning at 0600. Pt was seen at his PCP office but was sent to ED for pain. Pt reports pain in his central chest, and is unable to describe the pain. Pt is A/O. Pt reports smoking marijuana yesterday. MD office gave pt 4mg  of zofran, and a GI cocktail. EMS gave pt 324mg  of ASA and 2 nitroglycerins with no relief. [facility].

## 2015-03-16 NOTE — Discharge Instructions (Signed)
Continue taking your prescription for Protonix as prescribed. You may take Zofran as needed for pain control. Follow up with [facility] within the next week. Please return to the Emergency Department if symptoms worsen or new onset of fever, blood in emesis, abdominal pain, chest pain.

## 2015-10-07 ENCOUNTER — Encounter (HOSPITAL_COMMUNITY): Payer: Self-pay | Admitting: Emergency Medicine

## 2015-10-07 ENCOUNTER — Emergency Department (HOSPITAL_COMMUNITY): Payer: Managed Care, Other (non HMO)

## 2015-10-07 ENCOUNTER — Emergency Department (HOSPITAL_COMMUNITY)
Admission: EM | Admit: 2015-10-07 | Discharge: 2015-10-07 | Disposition: A | Discharge status: Home / Self Care | Payer: Managed Care, Other (non HMO) | Attending: Emergency Medicine | Admitting: Emergency Medicine

## 2015-10-07 DIAGNOSIS — F1210 Cannabis abuse, uncomplicated: Secondary | ICD-10-CM | POA: Diagnosis not present

## 2015-10-07 DIAGNOSIS — I10 Essential (primary) hypertension: Secondary | ICD-10-CM | POA: Diagnosis not present

## 2015-10-07 DIAGNOSIS — R1110 Vomiting, unspecified: Secondary | ICD-10-CM | POA: Insufficient documentation

## 2015-10-07 DIAGNOSIS — R079 Chest pain, unspecified: Secondary | ICD-10-CM | POA: Diagnosis not present

## 2015-10-07 DIAGNOSIS — Z79899 Other long term (current) drug therapy: Secondary | ICD-10-CM | POA: Diagnosis not present

## 2015-10-07 DIAGNOSIS — F17210 Nicotine dependence, cigarettes, uncomplicated: Secondary | ICD-10-CM | POA: Insufficient documentation

## 2015-10-07 DIAGNOSIS — K219 Gastro-esophageal reflux disease without esophagitis: Secondary | ICD-10-CM | POA: Diagnosis not present

## 2015-10-07 DIAGNOSIS — F1290 Cannabis use, unspecified, uncomplicated: Secondary | ICD-10-CM

## 2015-10-07 DIAGNOSIS — F12988 Cannabis use, unspecified with other cannabis-induced disorder: Secondary | ICD-10-CM

## 2015-10-07 LAB — RAPID URINE DRUG SCREEN, HOSP PERFORMED
Amphetamines: NOT DETECTED
BARBITURATES: NOT DETECTED
Benzodiazepines: NOT DETECTED
Cocaine: NOT DETECTED
Opiates: NOT DETECTED
Tetrahydrocannabinol: POSITIVE — AB

## 2015-10-07 LAB — I-STAT TROPONIN, ED
TROPONIN I, POC: 0 ng/mL (ref 0.00–0.08)
Troponin i, poc: 0 ng/mL (ref 0.00–0.08)

## 2015-10-07 LAB — I-STAT CHEM 8, ED
BUN: 12 mg/dL (ref 6–20)
CALCIUM ION: 1.13 mmol/L (ref 1.12–1.23)
Chloride: 104 mmol/L (ref 101–111)
Creatinine, Ser: 0.8 mg/dL (ref 0.61–1.24)
GLUCOSE: 151 mg/dL — AB (ref 65–99)
HCT: 54 % — ABNORMAL HIGH (ref 39.0–52.0)
HEMOGLOBIN: 18.4 g/dL — AB (ref 13.0–17.0)
Potassium: 4.4 mmol/L (ref 3.5–5.1)
Sodium: 142 mmol/L (ref 135–145)
TCO2: 26 mmol/L (ref 0–100)

## 2015-10-07 MED ORDER — CAPSAICIN 0.025 % EX CREA
TOPICAL_CREAM | Freq: Two times a day (BID) | CUTANEOUS | Status: DC
  Administered 2015-10-07: 10:00:00 via TOPICAL
  Filled 2015-10-07: qty 56.6

## 2015-10-07 MED ORDER — PROMETHAZINE HCL 25 MG PO TABS: 25.0000 mg | ORAL_TABLET | Freq: Four times a day (QID) | ORAL | Status: DC | PRN

## 2015-10-07 MED ORDER — PANTOPRAZOLE SODIUM 20 MG PO TBEC: 20.0000 mg | DELAYED_RELEASE_TABLET | Freq: Every day | ORAL | Status: DC

## 2015-10-07 MED ORDER — HALOPERIDOL LACTATE 5 MG/ML IJ SOLN
2.0000 mg | Freq: Once | INTRAMUSCULAR | Status: AC
  Administered 2015-10-07: 2 mg via INTRAVENOUS
  Filled 2015-10-07: qty 1

## 2015-10-07 MED ORDER — HALOPERIDOL LACTATE 5 MG/ML IJ SOLN
3.0000 mg | Freq: Once | INTRAMUSCULAR | Status: AC
  Administered 2015-10-07: 3 mg via INTRAVENOUS
  Filled 2015-10-07: qty 1

## 2015-10-07 MED ORDER — PANTOPRAZOLE SODIUM 40 MG IV SOLR
40.0000 mg | Freq: Once | INTRAVENOUS | Status: AC
  Administered 2015-10-07: 40 mg via INTRAVENOUS
  Filled 2015-10-07: qty 40

## 2015-10-07 MED ORDER — SODIUM CHLORIDE 0.9 % IV BOLUS (SEPSIS)
1000.0000 mL | Freq: Once | INTRAVENOUS | Status: AC
  Administered 2015-10-07: 1000 mL via INTRAVENOUS

## 2015-10-07 NOTE — ED Notes (Signed)
Pt reports CP since 0100 this am accompanied by sob and L arm pain.

## 2015-10-07 NOTE — ED Notes (Signed)
Made first request for urine sample,pt states he is unable to provide one at this time.

## 2015-10-07 NOTE — ED Notes (Signed)
Patient transported to X-ray 

## 2015-10-07 NOTE — ED Provider Notes (Signed)
CSN: [id number]     Arrival date & time 10/07/15  [id number] History   First MD Initiated Contact with Patient 10/07/15 [phone]     Chief Complaint  Patient presents with  . Chest Pain      Patient is a 35 y.o. male presenting with chest pain.  Chest Pain  Patient presents with vomiting and chest pain since 1 AM this morning.  Patient has history of GERD.  Also has history of frequent marijuana abuse.  Denies fever chills.  Denies hematemesis. Past Medical History  Diagnosis Date  . GERD (gastroesophageal reflux disease)   . Hypertension    History reviewed. No pertinent past surgical history. History reviewed. No pertinent family history. Social History  Substance Use Topics  . Smoking status: Current Every Day Smoker -- 1.00 packs/day    Types: Cigarettes  . Smokeless tobacco: None  . Alcohol Use: Yes    Review of Systems  Cardiovascular: Positive for chest pain.  All other systems reviewed and are negative.     Allergies  Review of patient's allergies indicates no known allergies.  Home Medications   Prior to Admission medications   Medication Sig Start Date End Date Taking? Authorizing Provider  pantoprazole (PROTONIX) 20 MG tablet Take 1 tablet (20 mg total) by mouth daily. 10/07/15   Nelva Nayobert Lourdez Mcgahan, MD  promethazine (PHENERGAN) 25 MG tablet Take 1 tablet (25 mg total) by mouth every 6 (six) hours as needed for nausea or vomiting. 10/07/15   Nelva Nayobert Charlynn Salih, MD   BP 177/90 mmHg  Pulse 73  Temp(Src) 98.6 F (37 C) (Oral)  Resp 16  SpO2 100% Physical Exam  Constitutional: He is oriented to person, place, and time. He appears well-developed and well-nourished. No distress.  HENT:  Head: Normocephalic and atraumatic.  Eyes: Pupils are equal, round, and reactive to light.  Neck: Normal range of motion.  Cardiovascular: Normal rate and intact distal pulses.   Pulmonary/Chest: No respiratory distress.  Abdominal: Normal appearance. He exhibits no distension. There is no  tenderness. There is no rebound.  Musculoskeletal: Normal range of motion.  Neurological: He is alert and oriented to person, place, and time. No cranial nerve deficit.  Skin: Skin is warm and dry. No rash noted.  Psychiatric: He has a normal mood and affect. His behavior is normal.  Nursing note and vitals reviewed.   ED Course  Procedures (including critical care time) Medications  capsaicin (ZOSTRIX) 0.025 % cream ( Topical Given 10/07/15 0944)  sodium chloride 0.9 % bolus 1,000 mL (0 mLs Intravenous Stopped 10/07/15 0944)  haloperidol lactate (HALDOL) injection 3 mg (3 mg Intravenous Given 10/07/15 0817)  haloperidol lactate (HALDOL) injection 2 mg (2 mg Intravenous Given 10/07/15 1201)  pantoprazole (PROTONIX) injection 40 mg (40 mg Intravenous Given 10/07/15 1202)    Labs Review Labs Reviewed  URINE RAPID DRUG SCREEN, HOSP PERFORMED - Abnormal; Notable for the following:    Tetrahydrocannabinol POSITIVE (*)    All other components within normal limits  I-STAT CHEM 8, ED - Abnormal; Notable for the following:    Glucose, Bld 151 (*)    Hemoglobin 18.4 (*)    HCT 54.0 (*)    All other components within normal limits  I-STAT TROPOININ, ED  I-STAT TROPOININ, ED    Imaging Review Dg Chest 2 View  10/07/2015  CLINICAL DATA:  Mid chest pain starting this morning with nausea and vomiting, history smoking, hypertension, GERD EXAM: CHEST  2 VIEW COMPARISON:  03/16/2015 FINDINGS: Normal heart  size, mediastinal contours, and pulmonary vascularity. Lungs clear. No pleural effusion or pneumothorax. Bones unremarkable. IMPRESSION: Normal exam. Electronically Signed   By: Ulyses Southward M.D.   On: 10/07/2015 08:02   I have personally reviewed and evaluated these images and lab results as part of my medical decision-making.   EKG Interpretation   Date/Time:  Wednesday Oct 07 2015 07:37:23 EDT Ventricular Rate:  61 PR Interval:  145 QRS Duration: 102 QT Interval:  402 QTC Calculation:  405 R Axis:   84 Text Interpretation:  Sinus rhythm Probable left atrial enlargement ST  elev, probable normal early repol pattern Baseline wander in lead(s) V3  Abnormal ekg Confirmed by Radford Pax  MD, Sherle Mello ([id number]) on 10/07/2015 7:39:50  AM      MDM   Final diagnoses:  Cannabinoid hyperemesis syndrome (HCC)        Nelva Nay, MD 10/07/15 1344

## 2015-10-07 NOTE — ED Notes (Signed)
PT DISCHARGED. INSTRUCTIONS AND PRESCRIPTIONS GIVEN. AAOX3. PT IN NO APPARENT DISTRESS OR PAIN. THE OPPORTUNITY TO ASK QUESTIONS WAS PROVIDED. 

## 2015-10-12 ENCOUNTER — Emergency Department (HOSPITAL_COMMUNITY)
Admission: EM | Admit: 2015-10-12 | Discharge: 2015-10-12 | Disposition: A | Discharge status: Home / Self Care | Payer: Managed Care, Other (non HMO) | Attending: Emergency Medicine | Admitting: Emergency Medicine

## 2015-10-12 ENCOUNTER — Emergency Department (HOSPITAL_COMMUNITY): Payer: Managed Care, Other (non HMO)

## 2015-10-12 ENCOUNTER — Encounter (HOSPITAL_COMMUNITY): Payer: Self-pay | Admitting: *Deleted

## 2015-10-12 DIAGNOSIS — R112 Nausea with vomiting, unspecified: Secondary | ICD-10-CM | POA: Insufficient documentation

## 2015-10-12 DIAGNOSIS — I10 Essential (primary) hypertension: Secondary | ICD-10-CM | POA: Diagnosis not present

## 2015-10-12 DIAGNOSIS — R1013 Epigastric pain: Secondary | ICD-10-CM | POA: Diagnosis present

## 2015-10-12 DIAGNOSIS — F17210 Nicotine dependence, cigarettes, uncomplicated: Secondary | ICD-10-CM | POA: Diagnosis not present

## 2015-10-12 DIAGNOSIS — K219 Gastro-esophageal reflux disease without esophagitis: Secondary | ICD-10-CM | POA: Insufficient documentation

## 2015-10-12 LAB — CBC
HEMATOCRIT: 47.5 % (ref 39.0–52.0)
Hemoglobin: 16 g/dL (ref 13.0–17.0)
MCH: 32.2 pg (ref 26.0–34.0)
MCHC: 33.7 g/dL (ref 30.0–36.0)
MCV: 95.6 fL (ref 78.0–100.0)
Platelets: 254 10*3/uL (ref 150–400)
RBC: 4.97 MIL/uL (ref 4.22–5.81)
RDW: 12.9 % (ref 11.5–15.5)
WBC: 8.8 10*3/uL (ref 4.0–10.5)

## 2015-10-12 LAB — BASIC METABOLIC PANEL
Anion gap: 11 (ref 5–15)
BUN: 12 mg/dL (ref 6–20)
CHLORIDE: 105 mmol/L (ref 101–111)
CO2: 24 mmol/L (ref 22–32)
Calcium: 9.4 mg/dL (ref 8.9–10.3)
Creatinine, Ser: 0.93 mg/dL (ref 0.61–1.24)
GFR calc Af Amer: >60 mL/min (ref >60)
GFR calc non Af Amer: >60 mL/min (ref >60)
Glucose, Bld: 117 mg/dL — ABNORMAL HIGH (ref 65–99)
POTASSIUM: 3.8 mmol/L (ref 3.5–5.1)
SODIUM: 140 mmol/L (ref 135–145)

## 2015-10-12 LAB — I-STAT TROPONIN, ED: Troponin i, poc: 0 ng/mL (ref 0.00–0.08)

## 2015-10-12 LAB — LIPASE, BLOOD: LIPASE: 23 U/L (ref 11–51)

## 2015-10-12 LAB — HEPATIC FUNCTION PANEL
ALK PHOS: 39 U/L (ref 38–126)
ALT: 20 U/L (ref 17–63)
AST: 19 U/L (ref 15–41)
Albumin: 4.3 g/dL (ref 3.5–5.0)
Bilirubin, Direct: 0.1 mg/dL (ref 0.1–0.5)
Indirect Bilirubin: 0.4 mg/dL (ref 0.3–0.9)
TOTAL PROTEIN: 7 g/dL (ref 6.5–8.1)
Total Bilirubin: 0.5 mg/dL (ref 0.3–1.2)

## 2015-10-12 MED ORDER — METOCLOPRAMIDE HCL 10 MG PO TABS: 10.0000 mg | ORAL_TABLET | Freq: Four times a day (QID) | ORAL | Status: DC

## 2015-10-12 MED ORDER — MORPHINE SULFATE (PF) 4 MG/ML IV SOLN
4.0000 mg | Freq: Once | INTRAVENOUS | Status: AC
  Administered 2015-10-12: 4 mg via INTRAVENOUS
  Filled 2015-10-12: qty 1

## 2015-10-12 MED ORDER — ALUM & MAG HYDROXIDE-SIMETH 200-200-20 MG/5ML PO SUSP
15.0000 mL | Freq: Once | ORAL | Status: AC
  Administered 2015-10-12: 15 mL via ORAL
  Filled 2015-10-12: qty 30

## 2015-10-12 MED ORDER — METOCLOPRAMIDE HCL 5 MG/ML IJ SOLN
10.0000 mg | Freq: Once | INTRAMUSCULAR | Status: AC
  Administered 2015-10-12: 10 mg via INTRAVENOUS
  Filled 2015-10-12: qty 2

## 2015-10-12 MED ORDER — PANTOPRAZOLE SODIUM 40 MG IV SOLR
40.0000 mg | Freq: Once | INTRAVENOUS | Status: AC
  Administered 2015-10-12: 40 mg via INTRAVENOUS
  Filled 2015-10-12: qty 40

## 2015-10-12 MED ORDER — IOPAMIDOL (ISOVUE-300) INJECTION 61%
INTRAVENOUS | Status: AC
  Administered 2015-10-12: 100 mL
  Filled 2015-10-12: qty 100

## 2015-10-12 MED ORDER — ONDANSETRON HCL 4 MG/2ML IJ SOLN
4.0000 mg | Freq: Once | INTRAMUSCULAR | Status: AC
  Administered 2015-10-12: 4 mg via INTRAVENOUS
  Filled 2015-10-12: qty 2

## 2015-10-12 NOTE — ED Notes (Signed)
Patient transported to X-ray 

## 2015-10-12 NOTE — ED Provider Notes (Signed)
CSN: [id number]     Arrival date & time 10/12/15  1047 History   First MD Initiated Contact with Patient 10/12/15 1049     Chief Complaint  Patient presents with  . Chest Pain     (Consider location/radiation/quality/duration/timing/severity/associated sxs/prior Treatment) Patient is a 35 y.o. male presenting with general illness. The history is provided by the patient.  Illness Location:  Epigastric pain Severity:  Moderate Onset quality:  Sudden Duration:  1 day Timing:  Constant Progression:  Unchanged Chronicity:  New Context:  History of marijuana abuse. Was seen here a few days ago. Diagnosed with hyperemesis secondary to cannabinol use. Reports no marijuana use since that time. Now with 1 day of epigastric pain, radiates into the chest, nausea, emesis. No fevers and chills. No other infectious symptoms. Associated symptoms: abdominal pain, nausea and vomiting   Associated symptoms: no chest pain, no cough, no diarrhea, no fever, no headaches and no shortness of breath     Past Medical History  Diagnosis Date  . GERD (gastroesophageal reflux disease)   . Hypertension    History reviewed. No pertinent past surgical history. History reviewed. No pertinent family history. Social History  Substance Use Topics  . Smoking status: Current Every Day Smoker -- 1.00 packs/day    Types: Cigarettes  . Smokeless tobacco: None  . Alcohol Use: Yes    Review of Systems  Constitutional: Negative for fever and chills.  Respiratory: Negative for cough and shortness of breath.   Cardiovascular: Negative for chest pain.  Gastrointestinal: Positive for nausea, vomiting and abdominal pain. Negative for diarrhea and blood in stool.  Genitourinary: Negative for dysuria.  Musculoskeletal: Negative for back pain.  Neurological: Negative for light-headedness and headaches.  All other systems reviewed and are negative.     Allergies  Review of patient's allergies indicates no known  allergies.  Home Medications   Prior to Admission medications   Medication Sig Start Date End Date Taking? Authorizing Provider  metoCLOPramide (REGLAN) 10 MG tablet Take 1 tablet (10 mg total) by mouth every 6 (six) hours. 10/12/15   Lindalou HoseSean O'Rourke, MD  pantoprazole (PROTONIX) 20 MG tablet Take 1 tablet (20 mg total) by mouth daily. Patient not taking: Reported on 10/12/2015 10/07/15   Nelva Nayobert Beaton, MD  promethazine (PHENERGAN) 25 MG tablet Take 1 tablet (25 mg total) by mouth every 6 (six) hours as needed for nausea or vomiting. Patient not taking: Reported on 10/12/2015 10/07/15   Nelva Nayobert Beaton, MD   BP 138/81 mmHg  Pulse 60  Temp(Src) 97.9 F (36.6 C) (Oral)  Resp 20  SpO2 100% Physical Exam  Constitutional: He is oriented to person, place, and time. He appears well-developed and well-nourished.  Appears uncomfortable. Using his finger to attempt to induce vomiting when I entered the room.  HENT:  Head: Normocephalic and atraumatic.  Mouth/Throat: Mucous membranes are not dry.  Eyes: EOM are normal. Pupils are equal, round, and reactive to light.  Cardiovascular: Normal rate, regular rhythm and intact distal pulses.   Pulmonary/Chest: Effort normal. No respiratory distress.  Abdominal: Soft. There is tenderness in the epigastric area. There is no rebound, no guarding, no CVA tenderness, no tenderness at McBurney's point and negative Murphy's sign.  Musculoskeletal: He exhibits no edema.  Neurological: He is alert and oriented to person, place, and time.  Skin: Skin is warm and dry.    ED Course  Procedures (including critical care time) Labs Review Labs Reviewed  BASIC METABOLIC PANEL - Abnormal; Notable for  the following:    Glucose, Bld 117 (*)    All other components within normal limits  CBC  LIPASE, BLOOD  HEPATIC FUNCTION PANEL  I-STAT TROPOININ, ED    Imaging Review Dg Chest 2 View  10/12/2015  CLINICAL DATA:  Chest pain since this morning. EXAM: CHEST  2 VIEW  COMPARISON:  10/07/2015 FINDINGS: Cardiomediastinal silhouette is normal. Mediastinal contours appear intact. There is no evidence of focal airspace consolidation, pleural effusion or pneumothorax. Osseous structures are without acute abnormality. Soft tissues are grossly normal. IMPRESSION: No active cardiopulmonary disease. Electronically Signed   By: Ted Mcalpine M.D.   On: 10/12/2015 15:08   Ct Abdomen Pelvis W Contrast  10/12/2015  CLINICAL DATA:  Epigastric pain with nausea and vomiting EXAM: CT ABDOMEN AND PELVIS WITH CONTRAST TECHNIQUE: Multidetector CT imaging of the abdomen and pelvis was performed using the standard protocol following bolus administration of intravenous contrast. CONTRAST:  100 mL ISOVUE-300 COMPARISON:  None. FINDINGS: Lung bases are free of acute infiltrate or sizable effusion. The liver, spleen, gallbladder, adrenal glands and pancreas are all normal in their CT appearance. Kidneys are well visualized bilaterally. Normal renal excretion is seen. The appendix is within normal limits. The bladder is partially distended. No filling defect is seen. No pelvic mass lesion or lymphadenopathy is noted. No acute abnormality seen. IMPRESSION: No acute abnormality noted. Electronically Signed   By: Alcide Clever M.D.   On: 10/12/2015 16:35   I have personally reviewed and evaluated these images and lab results as part of my medical decision-making.   EKG Interpretation   Date/Time:  Monday Oct 12 2015 11:03:29 EDT Ventricular Rate:  68 PR Interval:  155 QRS Duration: 87 QT Interval:  384 QTC Calculation: 408 R Axis:   83 Text Interpretation:  Sinus rhythm ST elev, probable normal early repol  pattern No significant change since last tracing Confirmed by [facility] MD,  Barbara Cower [phone]) on 10/12/2015 3:24:54 PM      MDM   Final diagnoses:  Epigastric pain    35 year old male with no pertinent past medical history presenting with epigastric pain and nausea vomiting. Has  been here recently for this. Diagnosed with hyperemesis secondary to marijuana use. The patient does seem upset on initial evaluation. He is trying to make himself throw up putting his finger down his throat. Some tenderness palpation in his epigastric area. Otherwise physical exam is normal. He has never been tachycardic, hypotensive, tachypnea, or hypoxic. Electrolytes are within normal limits. No AKI. No leukocytosis. No anemia. EKG without ischemic changes or interval abnormalities. Patient had a delta troponin a few days ago. Troponin here negative. He has no risk factors for ACS. Doubt ACS at this time. Lipase shows no evidence of pancreatitis. LFTs are normal. CT abdomen and pelvis shows no abnormalities of the gallbladder or pancreas.  Of note the patient was asking for pain medications multiple times while here in the emergency department. After giving him a dose of pain medications, the patient fell asleep. After walking into the room to tell him his results, the patient awoke and asked for more pain medications. We'll not do that at this time.  Strict return precautions provided. Encouraged him to follow up with his primary care doctor on an outpatient basis in the next several days. Provided him a prescription for Reglan. Encouraged him to stop using marijuana.  Patient discharged in stable condition.  Lindalou Hose, MD 10/12/15 1655  Marily Memos, MD 10/13/15 [phone]

## 2015-10-12 NOTE — ED Notes (Signed)
Patient transported to CT 

## 2015-10-12 NOTE — ED Notes (Signed)
Pt presents via [facility] from home c/o chest pain/epigastric pain. Since 0700 this AM.   [facility] for GI issues, d/c with protonix and phenergen, unable to get them filled d/t financial issues.  Pt reports N/V, pain similar but worse than last time he was seen.  4 Zofran given en route, EKG unremarkable.  170/100, P-70 NSR, 100% RA.  A x 4.

## 2016-02-14 ENCOUNTER — Emergency Department (HOSPITAL_COMMUNITY)
Admission: EM | Admit: 2016-02-14 | Discharge: 2016-02-14 | Disposition: A | Discharge status: Home / Self Care | Payer: Managed Care, Other (non HMO) | Attending: Emergency Medicine | Admitting: Emergency Medicine

## 2016-02-14 ENCOUNTER — Emergency Department (HOSPITAL_COMMUNITY): Payer: Managed Care, Other (non HMO)

## 2016-02-14 ENCOUNTER — Encounter (HOSPITAL_COMMUNITY): Payer: Self-pay | Admitting: *Deleted

## 2016-02-14 DIAGNOSIS — K209 Esophagitis, unspecified without bleeding: Secondary | ICD-10-CM

## 2016-02-14 DIAGNOSIS — R079 Chest pain, unspecified: Secondary | ICD-10-CM

## 2016-02-14 DIAGNOSIS — F1290 Cannabis use, unspecified, uncomplicated: Secondary | ICD-10-CM | POA: Insufficient documentation

## 2016-02-14 DIAGNOSIS — F17210 Nicotine dependence, cigarettes, uncomplicated: Secondary | ICD-10-CM | POA: Insufficient documentation

## 2016-02-14 DIAGNOSIS — I10 Essential (primary) hypertension: Secondary | ICD-10-CM | POA: Insufficient documentation

## 2016-02-14 DIAGNOSIS — F419 Anxiety disorder, unspecified: Secondary | ICD-10-CM | POA: Diagnosis not present

## 2016-02-14 DIAGNOSIS — R0789 Other chest pain: Secondary | ICD-10-CM | POA: Diagnosis present

## 2016-02-14 LAB — HEPATIC FUNCTION PANEL
ALT: 30 U/L (ref 17–63)
AST: 24 U/L (ref 15–41)
Albumin: 5 g/dL (ref 3.5–5.0)
Alkaline Phosphatase: 44 U/L (ref 38–126)
Bilirubin, Direct: <0.1 mg/dL — ABNORMAL LOW (ref 0.1–0.5)
Total Bilirubin: 0.7 mg/dL (ref 0.3–1.2)
Total Protein: 8.4 g/dL — ABNORMAL HIGH (ref 6.5–8.1)

## 2016-02-14 LAB — CBC
HEMATOCRIT: 47.9 % (ref 39.0–52.0)
HEMOGLOBIN: 16.6 g/dL (ref 13.0–17.0)
MCH: 33.1 pg (ref 26.0–34.0)
MCHC: 34.7 g/dL (ref 30.0–36.0)
MCV: 95.4 fL (ref 78.0–100.0)
Platelets: 259 10*3/uL (ref 150–400)
RBC: 5.02 MIL/uL (ref 4.22–5.81)
RDW: 13.1 % (ref 11.5–15.5)
WBC: 10.7 10*3/uL — AB (ref 4.0–10.5)

## 2016-02-14 LAB — BASIC METABOLIC PANEL
ANION GAP: 8 (ref 5–15)
BUN: 10 mg/dL (ref 6–20)
CALCIUM: 10 mg/dL (ref 8.9–10.3)
CO2: 27 mmol/L (ref 22–32)
Chloride: 107 mmol/L (ref 101–111)
Creatinine, Ser: 0.97 mg/dL (ref 0.61–1.24)
GLUCOSE: 139 mg/dL — AB (ref 65–99)
POTASSIUM: 4.2 mmol/L (ref 3.5–5.1)
Sodium: 142 mmol/L (ref 135–145)

## 2016-02-14 LAB — LIPASE, BLOOD: Lipase: 18 U/L (ref 11–51)

## 2016-02-14 LAB — I-STAT TROPONIN, ED: TROPONIN I, POC: 0 ng/mL (ref 0.00–0.08)

## 2016-02-14 MED ORDER — GI COCKTAIL ~~LOC~~
30.0000 mL | Freq: Once | ORAL | Status: AC
  Administered 2016-02-14: 30 mL via ORAL
  Filled 2016-02-14: qty 30

## 2016-02-14 MED ORDER — DIPHENHYDRAMINE HCL 50 MG/ML IJ SOLN
25.0000 mg | Freq: Once | INTRAMUSCULAR | Status: AC
  Administered 2016-02-14: 25 mg via INTRAVENOUS
  Filled 2016-02-14: qty 1

## 2016-02-14 MED ORDER — HALOPERIDOL LACTATE 5 MG/ML IJ SOLN
5.0000 mg | Freq: Once | INTRAMUSCULAR | Status: AC
  Administered 2016-02-14: 5 mg via INTRAVENOUS
  Filled 2016-02-14: qty 1

## 2016-02-14 MED ORDER — SUCRALFATE 1 G PO TABS: 1.0000 g | ORAL_TABLET | Freq: Four times a day (QID) | ORAL | 0 refills | Status: DC

## 2016-02-14 MED ORDER — FENTANYL CITRATE (PF) 100 MCG/2ML IJ SOLN
100.0000 ug | Freq: Once | INTRAMUSCULAR | Status: AC
  Administered 2016-02-14: 100 ug via INTRAVENOUS
  Filled 2016-02-14: qty 2

## 2016-02-14 MED ORDER — FAMOTIDINE IN NACL 20-0.9 MG/50ML-% IV SOLN
20.0000 mg | Freq: Once | INTRAVENOUS | Status: AC
  Administered 2016-02-14: 20 mg via INTRAVENOUS
  Filled 2016-02-14: qty 50

## 2016-02-14 MED ORDER — ONDANSETRON HCL 4 MG/2ML IJ SOLN
4.0000 mg | Freq: Once | INTRAMUSCULAR | Status: AC
  Administered 2016-02-14: 4 mg via INTRAVENOUS
  Filled 2016-02-14: qty 2

## 2016-02-14 MED ORDER — OMEPRAZOLE 20 MG PO CPDR: 20.0000 mg | DELAYED_RELEASE_CAPSULE | Freq: Two times a day (BID) | ORAL | 0 refills | Status: DC

## 2016-02-14 MED ORDER — FENTANYL CITRATE (PF) 100 MCG/2ML IJ SOLN: 100.0000 ug | INTRAMUSCULAR | Status: DC | PRN

## 2016-02-14 NOTE — Discharge Instructions (Signed)
Esophagitis can be caused by smoking, binge drinking, caffeine, and anti-inflamatory medications.  Call [facility] for a follow-up appointment to discuss an endoscopy procedure to confirm your diagnosis.

## 2016-02-14 NOTE — ED Notes (Signed)
Bed: [id number] Expected date:  Expected time:  Means of arrival:  Comments: 35 yo R rib pain, fall

## 2016-02-14 NOTE — ED Notes (Signed)
MD at bedside. 

## 2016-02-14 NOTE — ED Notes (Signed)
Patient actively vomiting  MD made aware 

## 2016-02-14 NOTE — ED Notes (Signed)
Patients visitor reported to RN that patient is feeling better and ready to go home. Will notify provider.

## 2016-02-14 NOTE — ED Notes (Signed)
Unable to collect labs patient is not in the room 

## 2016-02-14 NOTE — ED Triage Notes (Signed)
Pt complains of chest pain since this morning. Pain occurred while driving to work.

## 2016-02-14 NOTE — ED Notes (Signed)
Patient transported to X-ray 

## 2016-02-14 NOTE — ED Provider Notes (Signed)
[facility]-EMERGENCY DEPT Provider Note   CSN: [id number] Arrival date & time: 02/14/16  1419     History   Chief Complaint Chief Complaint  Patient presents with  . Chest Pain    HPI Kyle Pratt is a 35 y.o. male.  He presents here with chest pain. He describes sudden onset of severe substernal chest pain is described as sharp and some burning upon waking this morning. States that multiple episodes over the last few years and has been seen in ER multiple times for his never been seen by physician outside emergency room. No history of heart or lung disease. He is daily smoker. He uses marijuana daily. States he does not drink heavily. However, questioning states that yesterday he had several shots and several beers. No history of known liver disease or pancreatitis.  HPI  Past Medical History:  Diagnosis Date  . GERD (gastroesophageal reflux disease)   . Hypertension     There are no active problems to display for this patient.   History reviewed. No pertinent surgical history.     Home Medications    Prior to Admission medications   Medication Sig Start Date End Date Taking? Authorizing Provider  metoCLOPramide (REGLAN) 10 MG tablet Take 1 tablet (10 mg total) by mouth every 6 (six) hours. Patient not taking: Reported on 02/14/2016 10/12/15   Lindalou Hose, MD  omeprazole (PRILOSEC) 20 MG capsule Take 1 capsule (20 mg total) by mouth 2 (two) times daily. 02/14/16   Rolland Porter, MD  pantoprazole (PROTONIX) 20 MG tablet Take 1 tablet (20 mg total) by mouth daily. Patient not taking: Reported on 02/14/2016 10/07/15   Nelva Nay, MD  promethazine (PHENERGAN) 25 MG tablet Take 1 tablet (25 mg total) by mouth every 6 (six) hours as needed for nausea or vomiting. Patient not taking: Reported on 02/14/2016 10/07/15   Nelva Nay, MD  sucralfate (CARAFATE) 1 g tablet Take 1 tablet (1 g total) by mouth 4 (four) times daily. 02/14/16   Rolland Porter, MD    Family History No family history  on file.  Social History Social History  Substance Use Topics  . Smoking status: Current Every Day Smoker    Packs/day: 1.00    Types: Cigarettes  . Smokeless tobacco: Never Used  . Alcohol use Yes     Allergies   Review of patient's allergies indicates no known allergies.   Review of Systems Review of Systems  Constitutional: Negative for appetite change, chills, diaphoresis, fatigue and fever.  HENT: Negative for mouth sores, sore throat and trouble swallowing.   Eyes: Negative for visual disturbance.  Respiratory: Negative for cough, chest tightness, shortness of breath and wheezing.   Cardiovascular: Positive for chest pain.  Gastrointestinal: Positive for nausea. Negative for abdominal distention, abdominal pain, diarrhea and vomiting.  Endocrine: Negative for polydipsia, polyphagia and polyuria.  Genitourinary: Negative for dysuria, frequency and hematuria.  Musculoskeletal: Negative for gait problem.  Skin: Negative for color change, pallor and rash.  Neurological: Negative for dizziness, syncope, light-headedness and headaches.  Hematological: Does not bruise/bleed easily.  Psychiatric/Behavioral: Negative for behavioral problems and confusion.     Physical Exam Updated Vital Signs BP (!) 171/110   Pulse 62   Temp 97.9 F (36.6 C) (Oral)   Resp 20   SpO2 96%   Physical Exam  Constitutional: He is oriented to person, place, and time. He appears well-developed and well-nourished. No distress.  Patient's eyes are open his eye and on the room. However is  very close his eyes and curls in the fetal position. With some encouragement he will resume a normal positional on examination.  HENT:  Head: Normocephalic.  Conjunctiva not pale. No scleral icterus.  Eyes: Conjunctivae are normal. Pupils are equal, round, and reactive to light. No scleral icterus.  Neck: Normal range of motion. Neck supple. No thyromegaly present.  Cardiovascular: Normal rate and regular  rhythm.  Exam reveals no gallop and no friction rub.   No murmur heard. Heart tones normal. No pleural or pericardial friction rub. Not tachycardic.  Pulmonary/Chest: Effort normal and breath sounds normal. No respiratory distress. He has no wheezes. He has no rales.  Clear bilateral breath sounds.  Abdominal: Soft. Bowel sounds are normal. He exhibits no distension. There is no tenderness. There is no rebound.  Soft benign abdomen  Musculoskeletal: Normal range of motion.  Neurological: He is alert and oriented to person, place, and time.  Skin: Skin is warm and dry. No rash noted.  Psychiatric: He has a normal mood and affect. His behavior is normal.     ED Treatments / Results  Labs (all labs ordered are listed, but only abnormal results are displayed) Labs Reviewed  BASIC METABOLIC PANEL - Abnormal; Notable for the following:       Result Value   Glucose, Bld 139 (*)    All other components within normal limits  CBC - Abnormal; Notable for the following:    WBC 10.7 (*)    All other components within normal limits  HEPATIC FUNCTION PANEL - Abnormal; Notable for the following:    Total Protein 8.4 (*)    Bilirubin, Direct <0.1 (*)    All other components within normal limits  LIPASE, BLOOD  I-STAT TROPOININ, ED    EKG  EKG Interpretation  Date/Time:  Sunday February 14 2016 14:30:02 EDT Ventricular Rate:  64 PR Interval:    QRS Duration: 80 QT Interval:  392 QTC Calculation: 405 R Axis:   77 Text Interpretation:  Sinus rhythm Probable left atrial enlargement ST elevation c/w early repol--Unchanged Confirmed by Fayrene Fearing  MD, Latunya Kissick ([id number]) on 02/14/2016 2:59:56 PM       Radiology Dg Chest 2 View  Result Date: 02/14/2016 CLINICAL DATA:  35 year old male with central chest pain EXAM: CHEST  2 VIEW COMPARISON:  Prior chest x-ray 10/12/2015 FINDINGS: The lungs are clear and negative for focal airspace consolidation, pulmonary edema or suspicious pulmonary nodule. No  pleural effusion or pneumothorax. Cardiac and mediastinal contours are within normal limits. No acute fracture or lytic or blastic osseous lesions. The visualized upper abdominal bowel gas pattern is unremarkable. IMPRESSION: Negative chest x-ray. Electronically Signed   By: Malachy Moan M.D.   On: 02/14/2016 15:03    Procedures Procedures (including critical care time)  Medications Ordered in ED Medications  famotidine (PEPCID) IVPB 20 mg premix (0 mg Intravenous Stopped 02/14/16 1628)  gi cocktail (Maalox,Lidocaine,Donnatal) (30 mLs Oral Given 02/14/16 1528)  ondansetron (ZOFRAN) injection 4 mg (4 mg Intravenous Given 02/14/16 1520)  fentaNYL (SUBLIMAZE) injection 100 mcg (100 mcg Intravenous Given 02/14/16 1522)  haloperidol lactate (HALDOL) injection 5 mg (5 mg Intravenous Given 02/14/16 1706)  diphenhydrAMINE (BENADRYL) injection 25 mg (25 mg Intravenous Given 02/14/16 1703)     Initial Impression / Assessment and Plan / ED Course  I have reviewed the triage vital signs and the nursing notes.  Pertinent labs & imaging results that were available during my care of the patient were reviewed by me and considered  in my medical decision making (see chart for details).  Clinical Course    HEENT medications. Complain of some continued nausea and anxiety. Given IV Haldol and Benadryl he improves. EKG shows early repo but no acute changes. Chest x-ray shows no acute abnormalities no pleural effusions no pneumothorax or pneumomediastinum. No artery mainly. Final Clinical Impressions(s) / ED Diagnoses   Final diagnoses:  Chest pain, unspecified chest pain type  Esophagitis    New Prescriptions Discharge Medication List as of 02/14/2016  7:24 PM    START taking these medications   Details  omeprazole (PRILOSEC) 20 MG capsule Take 1 capsule (20 mg total) by mouth 2 (two) times daily., Starting Sun 02/14/2016, Print    sucralfate (CARAFATE) 1 g tablet Take 1 tablet (1 g total) by mouth 4  (four) times daily., Starting Sun 02/14/2016, Print         Rolland PorterMark Robyne Matar, MD 02/15/16 0000

## 2017-01-11 ENCOUNTER — Emergency Department (HOSPITAL_COMMUNITY)
Admission: EM | Admit: 2017-01-11 | Discharge: 2017-01-11 | Disposition: A | Discharge status: Home / Self Care | Payer: Managed Care, Other (non HMO) | Attending: Emergency Medicine | Admitting: Emergency Medicine

## 2017-01-11 ENCOUNTER — Emergency Department (HOSPITAL_COMMUNITY): Payer: Managed Care, Other (non HMO)

## 2017-01-11 DIAGNOSIS — R0789 Other chest pain: Secondary | ICD-10-CM | POA: Insufficient documentation

## 2017-01-11 DIAGNOSIS — F17210 Nicotine dependence, cigarettes, uncomplicated: Secondary | ICD-10-CM | POA: Insufficient documentation

## 2017-01-11 DIAGNOSIS — I10 Essential (primary) hypertension: Secondary | ICD-10-CM | POA: Diagnosis not present

## 2017-01-11 DIAGNOSIS — R40241 Glasgow coma scale score 13-15, unspecified time: Secondary | ICD-10-CM | POA: Insufficient documentation

## 2017-01-11 DIAGNOSIS — R112 Nausea with vomiting, unspecified: Secondary | ICD-10-CM | POA: Diagnosis not present

## 2017-01-11 DIAGNOSIS — R1013 Epigastric pain: Secondary | ICD-10-CM | POA: Diagnosis present

## 2017-01-11 DIAGNOSIS — R0602 Shortness of breath: Secondary | ICD-10-CM | POA: Diagnosis not present

## 2017-01-11 LAB — COMPREHENSIVE METABOLIC PANEL
ALT: 24 U/L (ref 17–63)
AST: 26 U/L (ref 15–41)
Albumin: 4.2 g/dL (ref 3.5–5.0)
Alkaline Phosphatase: 38 U/L (ref 38–126)
Anion gap: 6 (ref 5–15)
BUN: 8 mg/dL (ref 6–20)
CHLORIDE: 111 mmol/L (ref 101–111)
CO2: 25 mmol/L (ref 22–32)
Calcium: 8.9 mg/dL (ref 8.9–10.3)
Creatinine, Ser: 0.92 mg/dL (ref 0.61–1.24)
Glucose, Bld: 124 mg/dL — ABNORMAL HIGH (ref 65–99)
POTASSIUM: 4.9 mmol/L (ref 3.5–5.1)
Sodium: 142 mmol/L (ref 135–145)
Total Bilirubin: 0.5 mg/dL (ref 0.3–1.2)
Total Protein: 6.9 g/dL (ref 6.5–8.1)

## 2017-01-11 LAB — CBC WITH DIFFERENTIAL/PLATELET
BASOS ABS: 0 10*3/uL (ref 0.0–0.1)
Basophils Relative: 0 %
Eosinophils Absolute: 0 10*3/uL (ref 0.0–0.7)
Eosinophils Relative: 0 %
HCT: 44.8 % (ref 39.0–52.0)
Hemoglobin: 15.2 g/dL (ref 13.0–17.0)
LYMPHS ABS: 0.7 10*3/uL (ref 0.7–4.0)
LYMPHS PCT: 5 %
MCH: 32.3 pg (ref 26.0–34.0)
MCHC: 33.9 g/dL (ref 30.0–36.0)
MCV: 95.1 fL (ref 78.0–100.0)
MONO ABS: 0.6 10*3/uL (ref 0.1–1.0)
Monocytes Relative: 5 %
Neutro Abs: 12.2 10*3/uL — ABNORMAL HIGH (ref 1.7–7.7)
Neutrophils Relative %: 90 %
PLATELETS: 246 10*3/uL (ref 150–400)
RBC: 4.71 MIL/uL (ref 4.22–5.81)
RDW: 12.8 % (ref 11.5–15.5)
WBC: 13.6 10*3/uL — ABNORMAL HIGH (ref 4.0–10.5)

## 2017-01-11 LAB — LIPASE, BLOOD: LIPASE: 20 U/L (ref 11–51)

## 2017-01-11 LAB — TROPONIN I

## 2017-01-11 MED ORDER — PANTOPRAZOLE SODIUM 40 MG IV SOLR
40.0000 mg | Freq: Once | INTRAVENOUS | Status: AC
  Administered 2017-01-11: 40 mg via INTRAVENOUS
  Filled 2017-01-11: qty 40

## 2017-01-11 MED ORDER — ONDANSETRON HCL 4 MG/2ML IJ SOLN
4.0000 mg | Freq: Once | INTRAMUSCULAR | Status: AC
  Administered 2017-01-11: 4 mg via INTRAVENOUS
  Filled 2017-01-11: qty 2

## 2017-01-11 MED ORDER — FAMOTIDINE 20 MG PO TABS
40.0000 mg | ORAL_TABLET | Freq: Once | ORAL | Status: AC
  Administered 2017-01-11: 40 mg via ORAL
  Filled 2017-01-11: qty 2

## 2017-01-11 MED ORDER — METOCLOPRAMIDE HCL 5 MG/ML IJ SOLN
5.0000 mg | Freq: Once | INTRAMUSCULAR | Status: AC
  Administered 2017-01-11: 5 mg via INTRAVENOUS
  Filled 2017-01-11: qty 2

## 2017-01-11 MED ORDER — SUCRALFATE 1 G PO TABS
1.0000 g | ORAL_TABLET | Freq: Once | ORAL | Status: AC
  Administered 2017-01-11: 1 g via ORAL
  Filled 2017-01-11: qty 1

## 2017-01-11 MED ORDER — MORPHINE SULFATE (PF) 4 MG/ML IV SOLN
4.0000 mg | Freq: Once | INTRAVENOUS | Status: AC
  Administered 2017-01-11: 4 mg via INTRAVENOUS
  Filled 2017-01-11: qty 1

## 2017-01-11 MED ORDER — GI COCKTAIL ~~LOC~~
30.0000 mL | Freq: Once | ORAL | Status: AC
  Administered 2017-01-11: 30 mL via ORAL
  Filled 2017-01-11: qty 30

## 2017-01-11 MED ORDER — DICYCLOMINE HCL 20 MG PO TABS: 20.0000 mg | ORAL_TABLET | Freq: Two times a day (BID) | ORAL | 0 refills | Status: DC

## 2017-01-11 MED ORDER — FAMOTIDINE 20 MG PO TABS: 20.0000 mg | ORAL_TABLET | Freq: Two times a day (BID) | ORAL | 0 refills | Status: DC

## 2017-01-11 MED ORDER — SODIUM CHLORIDE 0.9 % IV BOLUS (SEPSIS)
1000.0000 mL | Freq: Once | INTRAVENOUS | Status: AC
  Administered 2017-01-11: 1000 mL via INTRAVENOUS

## 2017-01-11 MED ORDER — LORAZEPAM 2 MG/ML IJ SOLN
0.5000 mg | Freq: Once | INTRAMUSCULAR | Status: AC
Start: 2017-01-11 — End: 2017-01-11
  Administered 2017-01-11: 0.5 mg via INTRAVENOUS
  Filled 2017-01-11: qty 1

## 2017-01-11 MED ORDER — FENTANYL CITRATE (PF) 100 MCG/2ML IJ SOLN
50.0000 ug | Freq: Once | INTRAMUSCULAR | Status: AC
  Administered 2017-01-11: 50 ug via INTRAVENOUS
  Filled 2017-01-11: qty 2

## 2017-01-11 MED ORDER — SODIUM CHLORIDE 0.9 % IV SOLN
INTRAVENOUS | Status: DC
  Administered 2017-01-11: 15 mL/h via INTRAVENOUS

## 2017-01-11 NOTE — ED Triage Notes (Signed)
Pt arrives via EMS from home with chest pain, nausea, vomiting that began this AM. CP started about an hour ago. Received 324mg  ASA, 4mg  zofran and 1 nitro with no change in symptoms. VSS.

## 2017-01-11 NOTE — ED Notes (Signed)
Pt requested water, pt given cup of ice water and ginger ale

## 2017-01-11 NOTE — ED Notes (Signed)
Dr.Allen at bedside with pt.  

## 2017-01-11 NOTE — ED Notes (Signed)
Pt verbalized understanding of d/c instructions and has no further questions. Pt stable and NAD. VSS.  

## 2017-01-11 NOTE — ED Provider Notes (Signed)
[facility]-EMERGENCY DEPT Provider Note   CSN: [id number] Arrival date & time: 01/11/17  1328     History   Chief Complaint Chief Complaint  Patient presents with  . Chest Pain    HPI Kyle Pratt is a 36 y.o. male.  36 year old male presents with acute onset of epigastric pain that radiates to his chest. Symptoms occurred at rest and do not radiate into his back up into his neck. Associated nausea and vomiting. No diaphoresis. No dyspnea. No recent leg pain or swelling. History of prior symptoms with ED visit last year diagnosed with GERD. Does not currently take any current medications at this time. Denies any syncope or near-syncope. Called EMS and was transported here. Denies any prior history of CAD.      Past Medical History:  Diagnosis Date  . GERD (gastroesophageal reflux disease)   . Hypertension     There are no active problems to display for this patient.   No past surgical history on file.     Home Medications    Prior to Admission medications   Medication Sig Start Date End Date Taking? Authorizing Provider  metoCLOPramide (REGLAN) 10 MG tablet Take 1 tablet (10 mg total) by mouth every 6 (six) hours. Patient not taking: Reported on 02/14/2016 10/12/15   Lindalou Hose'Rourke, Sean, MD  omeprazole (PRILOSEC) 20 MG capsule Take 1 capsule (20 mg total) by mouth 2 (two) times daily. 02/14/16   Rolland PorterJames, Mark, MD  pantoprazole (PROTONIX) 20 MG tablet Take 1 tablet (20 mg total) by mouth daily. Patient not taking: Reported on 02/14/2016 10/07/15   Nelva NayBeaton, Robert, MD  promethazine (PHENERGAN) 25 MG tablet Take 1 tablet (25 mg total) by mouth every 6 (six) hours as needed for nausea or vomiting. Patient not taking: Reported on 02/14/2016 10/07/15   Nelva NayBeaton, Robert, MD  sucralfate (CARAFATE) 1 g tablet Take 1 tablet (1 g total) by mouth 4 (four) times daily. 02/14/16   Rolland PorterJames, Mark, MD    Family History No family history on file.  Social History Social History  Substance Use Topics  .  Smoking status: Current Every Day Smoker    Packs/day: 1.00    Types: Cigarettes  . Smokeless tobacco: Never Used  . Alcohol use Yes     Allergies   Patient has no known allergies.   Review of Systems Review of Systems  All other systems reviewed and are negative.    Physical Exam Updated Vital Signs BP (!) 152/82 (BP Location: Left Arm)   Pulse 61   Temp 98.1 F (36.7 C) (Oral)   Resp (!) 22   SpO2 100%   Physical Exam  Constitutional: He is oriented to person, place, and time. He appears well-developed and well-nourished.  Non-toxic appearance. No distress.  HENT:  Head: Normocephalic and atraumatic.  Eyes: Pupils are equal, round, and reactive to light. Conjunctivae, EOM and lids are normal.  Neck: Normal range of motion. Neck supple. No tracheal deviation present. No thyroid mass present.  Cardiovascular: Normal rate, regular rhythm and normal heart sounds.  Exam reveals no gallop.   No murmur heard. Pulmonary/Chest: Effort normal and breath sounds normal. No stridor. No respiratory distress. He has no decreased breath sounds. He has no wheezes. He has no rhonchi. He has no rales.  Abdominal: Soft. Normal appearance and bowel sounds are normal. He exhibits no distension. There is no tenderness. There is no rebound and no CVA tenderness.    Musculoskeletal: Normal range of motion. He exhibits no edema  or tenderness.  Neurological: He is alert and oriented to person, place, and time. He has normal strength. No cranial nerve deficit or sensory deficit. GCS eye subscore is 4. GCS verbal subscore is 5. GCS motor subscore is 6.  Skin: Skin is warm and dry. No abrasion and no rash noted.  Psychiatric: He has a normal mood and affect. His speech is normal and behavior is normal.  Nursing note and vitals reviewed.    ED Treatments / Results  Labs (all labs ordered are listed, but only abnormal results are displayed) Labs Reviewed  CBC WITH DIFFERENTIAL/PLATELET    COMPREHENSIVE METABOLIC PANEL  TROPONIN I  LIPASE, BLOOD    EKG  EKG Interpretation  Date/Time:  Wednesday January 11 2017 13:37:51 EDT Ventricular Rate:  55 PR Interval:  150 QRS Duration: 96 QT Interval:  408 QTC Calculation: 390 R Axis:   87 Text Interpretation:  Sinus bradycardia with sinus arrhythmia Otherwise normal ECG Confirmed by Lorre Nick ([id number]) on 01/11/2017 3:09:37 PM       Radiology No results found.  Procedures Procedures (including critical care time)  Medications Ordered in ED Medications  0.9 %  sodium chloride infusion (15 mL/hr Intravenous New Bag/Given 01/11/17 1525)  LORazepam (ATIVAN) injection 0.5 mg (not administered)  gi cocktail (Maalox,Lidocaine,Donnatal) (not administered)  famotidine (PEPCID) tablet 40 mg (not administered)  morphine 4 MG/ML injection 4 mg (not administered)  sucralfate (CARAFATE) tablet 1 g (not administered)  metoCLOPramide (REGLAN) injection 5 mg (not administered)  ondansetron (ZOFRAN) injection 4 mg (4 mg Intravenous Given 01/11/17 1438)  sodium chloride 0.9 % bolus 1,000 mL (0 mLs Intravenous Stopped 01/11/17 1520)  fentaNYL (SUBLIMAZE) injection 50 mcg (50 mcg Intravenous Given 01/11/17 1453)     Initial Impression / Assessment and Plan / ED Course  I have reviewed the triage vital signs and the nursing notes.  Pertinent labs & imaging results that were available during my care of the patient were reviewed by me and considered in my medical decision making (see chart for details).     Patient treated here for suspected reflux and feels better. Will place on PPI and Carafate and give GI referral  Final Clinical Impressions(s) / ED Diagnoses   Final diagnoses:  SOB (shortness of breath)    New Prescriptions New Prescriptions   No medications on file     Lorre Nick, MD 01/11/17 Paulo Fruit

## 2017-01-13 ENCOUNTER — Emergency Department (HOSPITAL_COMMUNITY)
Admission: EM | Admit: 2017-01-13 | Discharge: 2017-01-13 | Disposition: A | Discharge status: Home / Self Care | Payer: Managed Care, Other (non HMO) | Attending: Emergency Medicine | Admitting: Emergency Medicine

## 2017-01-13 ENCOUNTER — Encounter (HOSPITAL_COMMUNITY): Payer: Self-pay | Admitting: *Deleted

## 2017-01-13 ENCOUNTER — Emergency Department (HOSPITAL_COMMUNITY): Payer: Managed Care, Other (non HMO)

## 2017-01-13 DIAGNOSIS — F17210 Nicotine dependence, cigarettes, uncomplicated: Secondary | ICD-10-CM | POA: Insufficient documentation

## 2017-01-13 DIAGNOSIS — I10 Essential (primary) hypertension: Secondary | ICD-10-CM | POA: Diagnosis not present

## 2017-01-13 DIAGNOSIS — R110 Nausea: Secondary | ICD-10-CM | POA: Insufficient documentation

## 2017-01-13 DIAGNOSIS — R200 Anesthesia of skin: Secondary | ICD-10-CM | POA: Diagnosis not present

## 2017-01-13 DIAGNOSIS — R0789 Other chest pain: Secondary | ICD-10-CM | POA: Diagnosis not present

## 2017-01-13 DIAGNOSIS — R079 Chest pain, unspecified: Secondary | ICD-10-CM

## 2017-01-13 LAB — CBC
HCT: 46 % (ref 39.0–52.0)
Hemoglobin: 15.8 g/dL (ref 13.0–17.0)
MCH: 32.6 pg (ref 26.0–34.0)
MCHC: 34.3 g/dL (ref 30.0–36.0)
MCV: 95 fL (ref 78.0–100.0)
Platelets: 262 K/uL (ref 150–400)
RBC: 4.84 MIL/uL (ref 4.22–5.81)
RDW: 13.3 % (ref 11.5–15.5)
WBC: 10 K/uL (ref 4.0–10.5)

## 2017-01-13 LAB — COMPREHENSIVE METABOLIC PANEL
ALT: 34 U/L (ref 17–63)
AST: 29 U/L (ref 15–41)
Albumin: 4.5 g/dL (ref 3.5–5.0)
Alkaline Phosphatase: 43 U/L (ref 38–126)
Anion gap: 11 (ref 5–15)
BILIRUBIN TOTAL: 0.7 mg/dL (ref 0.3–1.2)
BUN: 15 mg/dL (ref 6–20)
CHLORIDE: 106 mmol/L (ref 101–111)
CO2: 25 mmol/L (ref 22–32)
CREATININE: 1.11 mg/dL (ref 0.61–1.24)
Calcium: 9.4 mg/dL (ref 8.9–10.3)
Glucose, Bld: 119 mg/dL — ABNORMAL HIGH (ref 65–99)
Potassium: 3.7 mmol/L (ref 3.5–5.1)
Sodium: 142 mmol/L (ref 135–145)
TOTAL PROTEIN: 7.7 g/dL (ref 6.5–8.1)

## 2017-01-13 LAB — LIPASE, BLOOD: Lipase: 21 U/L (ref 11–51)

## 2017-01-13 LAB — TROPONIN I: Troponin I: <0.03 ng/mL (ref <0.03)

## 2017-01-13 MED ORDER — LORAZEPAM 2 MG/ML IJ SOLN
1.0000 mg | Freq: Once | INTRAMUSCULAR | Status: AC
  Administered 2017-01-13: 1 mg via INTRAVENOUS
  Filled 2017-01-13: qty 1

## 2017-01-13 MED ORDER — OMEPRAZOLE 20 MG PO CPDR: 20.0000 mg | DELAYED_RELEASE_CAPSULE | Freq: Two times a day (BID) | ORAL | 0 refills | Status: DC

## 2017-01-13 MED ORDER — SUCRALFATE 1 GM/10ML PO SUSP
1.0000 g | Freq: Once | ORAL | Status: AC
  Administered 2017-01-13: 1 g via ORAL
  Filled 2017-01-13: qty 10

## 2017-01-13 MED ORDER — SUCRALFATE 1 G PO TABS: 1.0000 g | ORAL_TABLET | Freq: Four times a day (QID) | ORAL | 0 refills | Status: DC

## 2017-01-13 MED ORDER — MORPHINE SULFATE (PF) 2 MG/ML IV SOLN
4.0000 mg | Freq: Once | INTRAVENOUS | Status: AC
  Administered 2017-01-13: 4 mg via INTRAVENOUS
  Filled 2017-01-13: qty 2

## 2017-01-13 MED ORDER — GI COCKTAIL ~~LOC~~
30.0000 mL | Freq: Once | ORAL | Status: AC
  Administered 2017-01-13: 30 mL via ORAL
  Filled 2017-01-13: qty 30

## 2017-01-13 NOTE — ED Triage Notes (Signed)
Patient is alert and oriented x4.  He is being seen for left sided chest pain that he states started this morning with numbness in the left hand.  Currently he rates his pain 10 of 10 with nausea

## 2017-01-13 NOTE — ED Notes (Signed)
Bed: WA08 Expected date:  Expected time:  Means of arrival:  Comments: 

## 2017-01-13 NOTE — ED Notes (Signed)
Pt requesting an IV, pain medication, and a drink. This RN informed the patient that we would get him what he needed after the MD assessment.

## 2017-01-13 NOTE — ED Provider Notes (Signed)
[facility]-EMERGENCY DEPT Provider Note   CSN: [id number] Arrival date & time: 01/13/17  1013     History   Chief Complaint Chief Complaint  Patient presents with  . Chest Pain    HPI Kyle Pratt is a 36 y.o. male.  HPI Patient presents with worsening chest pain.  He was seen yesterday in emergency department for the same pain.  He reports left-sided chest pain.  He has paresthesias in his bilateral hands left greater than right.  Denies back pain and neck pain.  No recent injury or trauma.  Denies heavy lifting.  No prior history of cardiac disease.  No family history of early cardiac disease.  Pain is moderate to severe in severity   Past Medical History:  Diagnosis Date  . GERD (gastroesophageal reflux disease)   . Hypertension     There are no active problems to display for this patient.   History reviewed. No pertinent surgical history.     Home Medications    Prior to Admission medications   Medication Sig Start Date End Date Taking? Authorizing Provider  dicyclomine (BENTYL) 20 MG tablet Take 1 tablet (20 mg total) by mouth 2 (two) times daily. 01/11/17  Yes Lorre Nick, MD  esomeprazole (NEXIUM) 20 MG capsule Take 20 mg by mouth daily.    Yes [provider]  famotidine (PEPCID) 20 MG tablet Take 1 tablet (20 mg total) by mouth 2 (two) times daily. 01/11/17  Yes Lorre Nick, MD  omeprazole (PRILOSEC) 20 MG capsule Take 1 capsule (20 mg total) by mouth 2 (two) times daily. 01/13/17   Azalia Bilis, MD  sucralfate (CARAFATE) 1 g tablet Take 1 tablet (1 g total) by mouth 4 (four) times daily. 01/13/17   Azalia Bilis, MD    Family History History reviewed. No pertinent family history.  Social History Social History  Substance Use Topics  . Smoking status: Current Every Day Smoker    Packs/day: 1.00    Types: Cigarettes  . Smokeless tobacco: Never Used  . Alcohol use Yes     Allergies   Patient has no known allergies.   Review of  Systems Review of Systems  All other systems reviewed and are negative.    Physical Exam Updated Vital Signs BP (!) 181/106   Pulse (!) 49   Temp 98 F (36.7 C) (Oral)   Resp 14   Ht 5\' 9"  (1.753 m)   Wt 79.4 kg (175 lb)   SpO2 100%   BMI 25.84 kg/m   Physical Exam  Constitutional: He is oriented to person, place, and time. He appears well-developed and well-nourished.  HENT:  Head: Normocephalic and atraumatic.  Eyes: EOM are normal.  Neck: Normal range of motion.  Cardiovascular: Normal rate, regular rhythm, normal heart sounds and intact distal pulses.   Pulmonary/Chest: Effort normal and breath sounds normal. No respiratory distress.  Abdominal: Soft. He exhibits no distension. There is no tenderness.  Musculoskeletal: Normal range of motion.  Neurological: He is alert and oriented to person, place, and time.  Skin: Skin is warm and dry.  Psychiatric: He has a normal mood and affect. Judgment normal.  Nursing note and vitals reviewed.    ED Treatments / Results  Labs (all labs ordered are listed, but only abnormal results are displayed) Labs Reviewed  COMPREHENSIVE METABOLIC PANEL - Abnormal; Notable for the following:       Result Value   Glucose, Bld 119 (*)    All other components within normal  limits  CBC  TROPONIN I  LIPASE, BLOOD    EKG  EKG Interpretation  Date/Time:  Friday January 13 2017 10:19:58 EDT Ventricular Rate:  70 PR Interval:    QRS Duration: 83 QT Interval:  374 QTC Calculation: 404 R Axis:   77 Text Interpretation:  Sinus rhythm ST elev, probable normal early repol pattern No significant change was found Confirmed by Azalia Bilis ([id number]) on 01/13/2017 11:21:58 AM       Radiology Dg Chest 2 View  Result Date: 01/13/2017 CLINICAL DATA:  Left-sided chest pain EXAM: CHEST  2 VIEW COMPARISON:  01/11/2017 FINDINGS: The heart size and mediastinal contours are within normal limits. Both lungs are clear. The visualized skeletal  structures are unremarkable. IMPRESSION: No active cardiopulmonary disease. Electronically Signed   By: Alcide Clever M.D.   On: 01/13/2017 12:30   Dg Chest 2 View  Result Date: 01/11/2017 CLINICAL DATA:  Pt arrives via EMS from home with chest pain, nausea, vomiting that began this AM. CP started about an hour ago. EXAM: CHEST  2 VIEW COMPARISON:  02/14/2016 FINDINGS: The heart size and mediastinal contours are within normal limits. Both lungs are clear. No pleural effusion or pneumothorax. The visualized skeletal structures are unremarkable. IMPRESSION: Normal chest radiographs. Electronically Signed   By: Amie Portland M.D.   On: 01/11/2017 16:58    Procedures Procedures (including critical care time)  Medications Ordered in ED Medications  morphine 2 MG/ML injection 4 mg (4 mg Intravenous Given 01/13/17 1232)  gi cocktail (Maalox,Lidocaine,Donnatal) (30 mLs Oral Given 01/13/17 1232)  LORazepam (ATIVAN) injection 1 mg (1 mg Intravenous Given 01/13/17 1310)  sucralfate (CARAFATE) 1 GM/10ML suspension 1 g (1 g Oral Given 01/13/17 1310)     Initial Impression / Assessment and Plan / ED Course  I have reviewed the triage vital signs and the nursing notes.  Pertinent labs & imaging results that were available during my care of the patient were reviewed by me and considered in my medical decision making (see chart for details).     Patient is overall well-appearing.  Suspect esophagitis/gastritis.  Outpatient referral to GI as I think he'll benefit from repeat endoscopy.  Home with Carafate.  Final Clinical Impressions(s) / ED Diagnoses   Final diagnoses:  Chest pain, unspecified type    New Prescriptions Current Discharge Medication List       Azalia Bilis, MD 01/13/17 [phone]

## 2017-05-25 ENCOUNTER — Emergency Department (HOSPITAL_COMMUNITY)
Admission: EM | Admit: 2017-05-25 | Discharge: 2017-05-25 | Disposition: A | Discharge status: Home / Self Care | Payer: Managed Care, Other (non HMO) | Attending: Emergency Medicine | Admitting: Emergency Medicine

## 2017-05-25 ENCOUNTER — Encounter (HOSPITAL_COMMUNITY): Payer: Self-pay | Admitting: Emergency Medicine

## 2017-05-25 ENCOUNTER — Emergency Department (HOSPITAL_COMMUNITY): Payer: Managed Care, Other (non HMO)

## 2017-05-25 DIAGNOSIS — R112 Nausea with vomiting, unspecified: Secondary | ICD-10-CM | POA: Diagnosis not present

## 2017-05-25 DIAGNOSIS — I10 Essential (primary) hypertension: Secondary | ICD-10-CM | POA: Insufficient documentation

## 2017-05-25 DIAGNOSIS — R072 Precordial pain: Secondary | ICD-10-CM | POA: Diagnosis not present

## 2017-05-25 DIAGNOSIS — F17210 Nicotine dependence, cigarettes, uncomplicated: Secondary | ICD-10-CM | POA: Diagnosis not present

## 2017-05-25 DIAGNOSIS — K2100 Gastro-esophageal reflux disease with esophagitis, without bleeding: Secondary | ICD-10-CM

## 2017-05-25 DIAGNOSIS — R079 Chest pain, unspecified: Secondary | ICD-10-CM | POA: Diagnosis present

## 2017-05-25 LAB — COMPREHENSIVE METABOLIC PANEL
ALBUMIN: 4.6 g/dL (ref 3.5–5.0)
ALK PHOS: 43 U/L (ref 38–126)
ALT: 31 U/L (ref 17–63)
ANION GAP: 11 (ref 5–15)
AST: 30 U/L (ref 15–41)
BUN: 13 mg/dL (ref 6–20)
CALCIUM: 9.6 mg/dL (ref 8.9–10.3)
CO2: 24 mmol/L (ref 22–32)
Chloride: 105 mmol/L (ref 101–111)
Creatinine, Ser: 0.96 mg/dL (ref 0.61–1.24)
GFR calc Af Amer: >60 mL/min (ref >60)
GFR calc non Af Amer: >60 mL/min (ref >60)
GLUCOSE: 136 mg/dL — AB (ref 65–99)
POTASSIUM: 4.4 mmol/L (ref 3.5–5.1)
SODIUM: 140 mmol/L (ref 135–145)
Total Bilirubin: 0.9 mg/dL (ref 0.3–1.2)
Total Protein: 7.4 g/dL (ref 6.5–8.1)

## 2017-05-25 LAB — LIPASE, BLOOD: Lipase: 18 U/L (ref 11–51)

## 2017-05-25 LAB — URINALYSIS, ROUTINE W REFLEX MICROSCOPIC
BACTERIA UA: NONE SEEN
BILIRUBIN URINE: NEGATIVE
Glucose, UA: 50 mg/dL — AB
Ketones, ur: 5 mg/dL — AB
LEUKOCYTES UA: NEGATIVE
NITRITE: NEGATIVE
PROTEIN: NEGATIVE mg/dL
Specific Gravity, Urine: 1.019 (ref 1.005–1.030)
Squamous Epithelial / LPF: NONE SEEN
pH: 6 (ref 5.0–8.0)

## 2017-05-25 LAB — CBC WITH DIFFERENTIAL/PLATELET
Basophils Absolute: 0 10*3/uL (ref 0.0–0.1)
Basophils Relative: 0 %
Eosinophils Absolute: 0 10*3/uL (ref 0.0–0.7)
Eosinophils Relative: 0 %
HEMATOCRIT: 47.2 % (ref 39.0–52.0)
Hemoglobin: 16 g/dL (ref 13.0–17.0)
LYMPHS PCT: 13 %
Lymphs Abs: 1.3 10*3/uL (ref 0.7–4.0)
MCH: 33.2 pg (ref 26.0–34.0)
MCHC: 33.9 g/dL (ref 30.0–36.0)
MCV: 97.9 fL (ref 78.0–100.0)
MONO ABS: 0.3 10*3/uL (ref 0.1–1.0)
MONOS PCT: 3 %
NEUTROS ABS: 8.8 10*3/uL — AB (ref 1.7–7.7)
Neutrophils Relative %: 84 %
Platelets: 281 10*3/uL (ref 150–400)
RBC: 4.82 MIL/uL (ref 4.22–5.81)
RDW: 13.3 % (ref 11.5–15.5)
WBC: 10.5 10*3/uL (ref 4.0–10.5)

## 2017-05-25 LAB — I-STAT TROPONIN, ED: Troponin i, poc: 0.01 ng/mL (ref 0.00–0.08)

## 2017-05-25 LAB — TROPONIN I: Troponin I: <0.03 ng/mL (ref <0.03)

## 2017-05-25 LAB — CBG MONITORING, ED: Glucose-Capillary: 136 mg/dL — ABNORMAL HIGH (ref 65–99)

## 2017-05-25 MED ORDER — ONDANSETRON HCL 4 MG/2ML IJ SOLN
4.0000 mg | Freq: Once | INTRAMUSCULAR | Status: AC
  Administered 2017-05-25: 4 mg via INTRAVENOUS
  Filled 2017-05-25: qty 2

## 2017-05-25 MED ORDER — SODIUM CHLORIDE 0.9 % IV BOLUS (SEPSIS)
1000.0000 mL | Freq: Once | INTRAVENOUS | Status: AC
  Administered 2017-05-25: 1000 mL via INTRAVENOUS

## 2017-05-25 MED ORDER — OMEPRAZOLE 20 MG PO CPDR: 20.0000 mg | DELAYED_RELEASE_CAPSULE | Freq: Every day | ORAL | 0 refills | Status: DC

## 2017-05-25 MED ORDER — HALOPERIDOL LACTATE 5 MG/ML IJ SOLN
5.0000 mg | Freq: Once | INTRAMUSCULAR | Status: AC
  Administered 2017-05-25: 5 mg via INTRAVENOUS
  Filled 2017-05-25: qty 1

## 2017-05-25 MED ORDER — ONDANSETRON 4 MG PO TBDP: 4.0000 mg | ORAL_TABLET | Freq: Three times a day (TID) | ORAL | 0 refills | Status: DC | PRN

## 2017-05-25 MED ORDER — GI COCKTAIL ~~LOC~~
30.0000 mL | Freq: Once | ORAL | Status: AC
  Administered 2017-05-25: 30 mL via ORAL
  Filled 2017-05-25: qty 30

## 2017-05-25 MED ORDER — ONDANSETRON HCL 4 MG/2ML IJ SOLN
4.0000 mg | INTRAMUSCULAR | Status: AC
Start: 2017-05-25 — End: 2017-05-25
  Administered 2017-05-25: 4 mg via INTRAVENOUS
  Filled 2017-05-25: qty 2

## 2017-05-25 MED ORDER — SUCRALFATE 1 G PO TABS: 1.0000 g | ORAL_TABLET | Freq: Three times a day (TID) | ORAL | 0 refills | Status: DC

## 2017-05-25 MED ORDER — FAMOTIDINE IN NACL 20-0.9 MG/50ML-% IV SOLN
20.0000 mg | Freq: Once | INTRAVENOUS | Status: AC
  Administered 2017-05-25: 20 mg via INTRAVENOUS
  Filled 2017-05-25: qty 50

## 2017-05-25 NOTE — ED Provider Notes (Signed)
[facility]   CSN: [id number] Arrival date & time: 05/25/17  1424     History   Chief Complaint Chief Complaint  Patient presents with  . Chest Pain    HPI Kyle Pratt is a 36 y.o. male.  Kyle Pratt is a 36 y.o. Male who presents to the ED complaining of chest pain since 7 am this morning.  Patient reports his pain began around 7 AM this morning and has been constant.  He reports some tightness in his chest with deep inspiration, but denies shortness of breath.  He reports associated vomiting, but no abdominal pain.  He reports trying to take an Nexium and vomited this back up.  He has been seen in the emergency department several times before with similar symptoms and this was related to GERD and esophagitis.  He has history of hypertension.  He is not a smoker.  He denies history of hyperlipidemia.  No treatments attempted prior to arrival.  No previous abdominal surgeries.  He denies of fevers, abdominal pain, hematemesis, shortness of breath, palpitations, leg pain, leg swelling, recent long travel, cough, hemoptysis, lightheadedness, dizziness or syncope.   The history is provided by the patient and medical records. No language interpreter was used.  Chest Pain   Associated symptoms include nausea and vomiting. Pertinent negatives include no abdominal pain, no back pain, no cough, no fever, no headaches, no palpitations and no shortness of breath.    Past Medical History:  Diagnosis Date  . GERD (gastroesophageal reflux disease)   . Hypertension     There are no active problems to display for this patient.   History reviewed. No pertinent surgical history.     Home Medications    Prior to Admission medications   Medication Sig Start Date End Date Taking? Authorizing Provider  omeprazole (PRILOSEC) 20 MG capsule Take 1 capsule (20 mg total) by mouth daily. 05/25/17   Everlene Farrieransie, Nykole Matos, PA-C  ondansetron (ZOFRAN  ODT) 4 MG disintegrating tablet Take 1 tablet (4 mg total) by mouth every 8 (eight) hours as needed for nausea or vomiting. 05/25/17   Everlene Farrieransie, Jorden Mahl, PA-C  sucralfate (CARAFATE) 1 g tablet Take 1 tablet (1 g total) by mouth 4 (four) times daily -  with meals and at bedtime. 05/25/17   Everlene Farrieransie, Kashish Yglesias, PA-C    Family History No family history on file.  Social History Social History   Tobacco Use  . Smoking status: Current Every Day Smoker    Packs/day: 1.00    Types: Cigarettes  . Smokeless tobacco: Never Used  Substance Use Topics  . Alcohol use: Yes    Comment: occasionally  . Drug use: Yes    Types: Marijuana     Allergies   Patient has no known allergies.   Review of Systems Review of Systems  Constitutional: Negative for chills and fever.  HENT: Negative for congestion and sore throat.   Eyes: Negative for visual disturbance.  Respiratory: Positive for chest tightness. Negative for cough, shortness of breath and wheezing.   Cardiovascular: Positive for chest pain. Negative for palpitations and leg swelling.  Gastrointestinal: Positive for nausea and vomiting. Negative for abdominal pain and diarrhea.  Genitourinary: Negative for dysuria.  Musculoskeletal: Negative for back pain and neck pain.  Skin: Negative for rash.  Neurological: Negative for syncope, light-headedness and headaches.     Physical Exam Updated Vital Signs BP (!) 165/81   Pulse (!) 56   Temp 98.7 F (  37.1 C) (Oral)   Resp 16   Ht 5\' 9"  (1.753 m)   Wt 83.9 kg (185 lb)   SpO2 100%   BMI 27.32 kg/m   Physical Exam  Constitutional: He appears well-developed and well-nourished.  Non-toxic appearance. He does not appear ill. No distress.  HENT:  Head: Normocephalic and atraumatic.  Mouth/Throat: Oropharynx is clear and moist.  Eyes: Conjunctivae are normal. Pupils are equal, round, and reactive to light. Right eye exhibits no discharge. Left eye exhibits no discharge.  Neck: Neck supple.    Cardiovascular: Normal rate, regular rhythm, normal heart sounds and intact distal pulses. Exam reveals no gallop and no friction rub.  No murmur heard. Pulses:      Radial pulses are 2+ on the right side, and 2+ on the left side.       Dorsalis pedis pulses are 2+ on the right side, and 2+ on the left side.       Posterior tibial pulses are 2+ on the right side, and 2+ on the left side.  Pulmonary/Chest: Effort normal and breath sounds normal. No respiratory distress. He has no wheezes. He has no rales.  Lungs are clear to ascultation bilaterally. Symmetric chest expansion bilaterally. No increased work of breathing. No rales or rhonchi.    Abdominal: Soft. Bowel sounds are normal. He exhibits no distension and no mass. There is no tenderness. There is no guarding.  Abdomen is soft and nontender to palpation.  Musculoskeletal: He exhibits no edema.       Right lower leg: He exhibits no tenderness and no edema.       Left lower leg: He exhibits no tenderness and no edema.  Lymphadenopathy:    He has no cervical adenopathy.  Neurological: He is alert. Coordination normal.  Skin: Skin is warm and dry. Capillary refill takes less than 2 seconds. No rash noted. He is not diaphoretic. No erythema. No pallor.  Psychiatric: He has a normal mood and affect. His behavior is normal.  Nursing note and vitals reviewed.    ED Treatments / Results  Labs (all labs ordered are listed, but only abnormal results are displayed) Labs Reviewed  COMPREHENSIVE METABOLIC PANEL - Abnormal; Notable for the following components:      Result Value   Glucose, Bld 136 (*)    All other components within normal limits  CBC WITH DIFFERENTIAL/PLATELET - Abnormal; Notable for the following components:   Neutro Abs 8.8 (*)    All other components within normal limits  URINALYSIS, ROUTINE W REFLEX MICROSCOPIC - Abnormal; Notable for the following components:   Glucose, UA 50 (*)    Hgb urine dipstick SMALL (*)     Ketones, ur 5 (*)    All other components within normal limits  CBG MONITORING, ED - Abnormal; Notable for the following components:   Glucose-Capillary 136 (*)    All other components within normal limits  LIPASE, BLOOD  TROPONIN I  I-STAT TROPONIN, ED  I-STAT TROPONIN, ED    EKG  EKG Interpretation  Date/Time:  Thursday May 25 2017 14:44:17 EST Ventricular Rate:  55 PR Interval:    QRS Duration: 95 QT Interval:  400 QTC Calculation: 383 R Axis:   86 Text Interpretation:  Pacemaker spikes or artifacts Sinus rhythm Consider right atrial enlargement Similar to prior.  Confirmed by Alona Bene [phone]) on 05/25/2017 3:14:59 PM       Radiology Dg Chest 2 View  Result Date: 05/25/2017 CLINICAL DATA:  Mid  chest pain, hit cups. History of CHF, asthma, diabetes, current smoker. EXAM: CHEST  2 VIEW COMPARISON:  Chest x-ray of January 13, 2017 FINDINGS: The lungs are mildly hyperinflated. There is no focal infiltrate. There is no pleural effusion or pneumothorax. The heart and pulmonary vascularity are normal. The retrosternal soft tissues are normal. There is no acute bony abnormality. IMPRESSION: Hyperinflation consistent with reactive airway disease or COPD. No CHF, pneumonia, nor other acute cardiopulmonary abnormality peer Electronically Signed   By: David  [place] M.D.   On: 05/25/2017 15:58    Procedures Procedures (including critical care time)  Medications Ordered in ED Medications  gi cocktail (Maalox,Lidocaine,Donnatal) (30 mLs Oral Given 05/25/17 1559)  famotidine (PEPCID) IVPB 20 mg premix (0 mg Intravenous Stopped 05/25/17 1629)  ondansetron (ZOFRAN) injection 4 mg (4 mg Intravenous Given 05/25/17 1559)  ondansetron (ZOFRAN) injection 4 mg (4 mg Intravenous Given 05/25/17 1710)  haloperidol lactate (HALDOL) injection 5 mg (5 mg Intravenous Given 05/25/17 1811)  sodium chloride 0.9 % bolus 1,000 mL (0 mLs Intravenous Stopped 05/25/17 1925)     Initial Impression  / Assessment and Plan / ED Course  I have reviewed the triage vital signs and the nursing notes.  Pertinent labs & imaging results that were available during my care of the patient were reviewed by me and considered in my medical decision making (see chart for details).     This is a 36 y.o. Male who presents to the ED complaining of chest pain since 7 am this morning.  Patient reports his pain began around 7 AM this morning and has been constant.  He reports some tightness in his chest with deep inspiration, but denies shortness of breath.  He reports associated vomiting, but no abdominal pain.  He reports trying to take an Nexium and vomited this back up.  He has been seen in the emergency department several times before with similar symptoms and this was related to GERD and esophagitis.  He has history of hypertension.  He is not a smoker.  He denies history of hyperlipidemia.  No treatments attempted prior to arrival.  No previous abdominal surgeries. Later, patient does admit to daily marijuana on exam patient is afebrile and nontoxic-appearing.  His lungs are clear to auscultation bilaterally.  Symmetric chest expansion bilaterally.  Abdomen is soft and nontender to palpation.  EKG without evidence of STEMI.  Appears similar to prior. CBC, C MP and lipase are all unremarkable.  Troponin is not elevated.  Chest x-ray shows some COPD versus reactive airway changes.  No pneumonia or infiltrate.  Urinalysis is without sign of infection. At reevaluation following Pepcid, GI cocktail and Zofran patient reports he is still not feeling better.  He is retching in the room.  He does admit to daily marijuana use.  Question an aspect of esophagitis with GERD and cyclical vomiting.  Will provide with a dose of Haldol and fluid bolus. At reevaluation patient is feeling much better.  He is no longer nauseated.  He is tolerating p.o.  He tells me he still having some pain up and down the center of his chest.  He has  been here multiple times with this chest pain that appears to be GERD.  Today appears to be the same.  He has not been taking any of the medications he was prescribed recently.  He has not been taking Nexium or the Carafate he was prescribed.  I discussed that if he does not take Nexium on a  regular basis this will not help him.  We will start him on omeprazole again and prescribed Carafate.  I also discussed cutting back on his marijuana use. I advised the patient to follow-up with their primary care provider this week. I advised the patient to return to the emergency department with new or worsening symptoms or new concerns. The patient verbalized understanding and agreement with plan.     Final Clinical Impressions(s) / ED Diagnoses   Final diagnoses:  Gastroesophageal reflux disease with esophagitis  Precordial pain  Non-intractable vomiting with nausea, unspecified vomiting type    ED Discharge Orders        Ordered    omeprazole (PRILOSEC) 20 MG capsule  Daily     05/25/17 2231    ondansetron (ZOFRAN ODT) 4 MG disintegrating tablet  Every 8 hours PRN     05/25/17 2231    sucralfate (CARAFATE) 1 g tablet  3 times daily with meals & bedtime     05/25/17 2231       Everlene FarrierDansie, Jayda White, PA-C 05/25/17 2237    Maia PlanLong, Joshua G, MD 05/26/17 [phone]

## 2017-05-25 NOTE — ED Notes (Signed)
Pt verbalized understanding discharge instructions and denies any further needs or questions at this time. VS stable, ambulatory and steady gait.   

## 2017-05-25 NOTE — ED Notes (Signed)
Pt able to keep fluids down.  

## 2017-05-25 NOTE — ED Notes (Signed)
Pt HR monitor appeared to be Huston FoleyBrady in the 20's,  RN reassessed Pt and informed PA. LL EKG was not connected.  Wave form from time during alarm on monitor not the same current waveform. Does not appear to be actually event.

## 2017-05-25 NOTE — ED Triage Notes (Signed)
Chest pain started at 7am, pt is diaphoretic, clammy, cold extremities, nauseated.

## 2017-10-30 ENCOUNTER — Emergency Department (HOSPITAL_COMMUNITY): Payer: Managed Care, Other (non HMO)

## 2017-10-30 ENCOUNTER — Emergency Department (HOSPITAL_COMMUNITY)
Admission: EM | Admit: 2017-10-30 | Discharge: 2017-10-31 | Disposition: A | Discharge status: Home / Self Care | Payer: Managed Care, Other (non HMO) | Attending: Emergency Medicine | Admitting: Emergency Medicine

## 2017-10-30 ENCOUNTER — Other Ambulatory Visit: Payer: Self-pay

## 2017-10-30 ENCOUNTER — Encounter (HOSPITAL_COMMUNITY): Payer: Self-pay | Admitting: Emergency Medicine

## 2017-10-30 DIAGNOSIS — R1013 Epigastric pain: Secondary | ICD-10-CM | POA: Insufficient documentation

## 2017-10-30 DIAGNOSIS — I10 Essential (primary) hypertension: Secondary | ICD-10-CM | POA: Insufficient documentation

## 2017-10-30 DIAGNOSIS — R0789 Other chest pain: Secondary | ICD-10-CM | POA: Insufficient documentation

## 2017-10-30 DIAGNOSIS — F17210 Nicotine dependence, cigarettes, uncomplicated: Secondary | ICD-10-CM | POA: Diagnosis not present

## 2017-10-30 DIAGNOSIS — R079 Chest pain, unspecified: Secondary | ICD-10-CM | POA: Diagnosis present

## 2017-10-30 LAB — BASIC METABOLIC PANEL
ANION GAP: 13 (ref 5–15)
BUN: 13 mg/dL (ref 6–20)
CHLORIDE: 107 mmol/L (ref 101–111)
CO2: 23 mmol/L (ref 22–32)
Calcium: 9.6 mg/dL (ref 8.9–10.3)
Creatinine, Ser: 0.96 mg/dL (ref 0.61–1.24)
Glucose, Bld: 145 mg/dL — ABNORMAL HIGH (ref 65–99)
Potassium: 3.9 mmol/L (ref 3.5–5.1)
SODIUM: 143 mmol/L (ref 135–145)

## 2017-10-30 LAB — CBC
HEMATOCRIT: 45.9 % (ref 39.0–52.0)
HEMOGLOBIN: 15.7 g/dL (ref 13.0–17.0)
MCH: 33.1 pg (ref 26.0–34.0)
MCHC: 34.2 g/dL (ref 30.0–36.0)
MCV: 96.6 fL (ref 78.0–100.0)
Platelets: 295 10*3/uL (ref 150–400)
RBC: 4.75 MIL/uL (ref 4.22–5.81)
RDW: 13.6 % (ref 11.5–15.5)
WBC: 12 10*3/uL — ABNORMAL HIGH (ref 4.0–10.5)

## 2017-10-30 LAB — I-STAT TROPONIN, ED: Troponin i, poc: 0 ng/mL (ref 0.00–0.08)

## 2017-10-30 LAB — MAGNESIUM: MAGNESIUM: 1.8 mg/dL (ref 1.7–2.4)

## 2017-10-30 MED ORDER — GI COCKTAIL ~~LOC~~
30.0000 mL | Freq: Once | ORAL | Status: AC
  Administered 2017-10-30: 30 mL via ORAL
  Filled 2017-10-30: qty 30

## 2017-10-30 MED ORDER — PANTOPRAZOLE SODIUM 40 MG PO TBEC
40.0000 mg | DELAYED_RELEASE_TABLET | Freq: Once | ORAL | Status: AC
  Administered 2017-10-30: 40 mg via ORAL
  Filled 2017-10-30: qty 1

## 2017-10-30 NOTE — ED Triage Notes (Signed)
Pt reports central chest pain that started at 5pm tonight. Pt reports pain radiates to right arm.

## 2017-10-30 NOTE — ED Provider Notes (Signed)
[facility]-EMERGENCY DEPT Provider Note   CSN: [id number] Arrival date & time: 10/30/17  2056     History   Chief Complaint Chief Complaint  Patient presents with  . Chest Pain    HPI Kyle Pratt is a 37 y.o. male.  The history is provided by the patient.  He has history of GERD and hypertension he comes in with chest pain throughout the throughout the day.  It started about 8 AM and is in the midsternal area.  He describes a tight, pressure feeling which is in the midsternal area. He describes a tight, pressure feeling which has started radiating into the right arm as the day went on. He had to leave workd because of the pain. There was mild dyspnea, mild nausea, slight diaphoresis. He has had similar pain in the past, but this is more severe.  He did note that it felt a little bit better when he took a hot shower, but nothing seemed to make his pain worse.  He has had similar pain in the past, but this is more severe.  He does admit to smoking 1 pack of cigarettes a day.  He denies history of hypertension, diabetes, hyperlipidemia even though his problem list includes hypertension.  There is no known family history of premature coronary atherosclerosis  Past Medical History:  Diagnosis Date  . GERD (gastroesophageal reflux disease)   . Hypertension     There are no active problems to display for this patient.   History reviewed. No pertinent surgical history.      Home Medications    Prior to Admission medications   Medication Sig Start Date End Date Taking? Authorizing Provider  omeprazole (PRILOSEC) 20 MG capsule Take 1 capsule (20 mg total) by mouth daily. 05/25/17   Everlene Farrier, PA-C  ondansetron (ZOFRAN ODT) 4 MG disintegrating tablet Take 1 tablet (4 mg total) by mouth every 8 (eight) hours as needed for nausea or vomiting. 05/25/17   Everlene Farrier, PA-C  sucralfate (CARAFATE) 1 g tablet Take 1 tablet (1 g total) by mouth 4 (four) times  daily -  with meals and at bedtime. 05/25/17   Everlene Farrier, PA-C    Family History History reviewed. No pertinent family history.  Social History Social History   Tobacco Use  . Smoking status: Current Every Day Smoker    Packs/day: 1.00    Types: Cigarettes  . Smokeless tobacco: Never Used  Substance Use Topics  . Alcohol use: Yes    Comment: occasionally  . Drug use: Yes    Types: Marijuana     Allergies   Patient has no known allergies.   Review of Systems Review of Systems  All other systems reviewed and are negative.    Physical Exam Updated Vital Signs BP (!) 148/92 (BP Location: Left Arm)   Pulse 61   Temp 98.9 F (37.2 C) (Oral)   Resp 11   Ht 5\' 9"  (1.753 m)   Wt 81.6 kg (180 lb)   SpO2 100%   BMI 26.58 kg/m   Physical Exam  Nursing note and vitals reviewed.  37 year old male, restless in the stretcher, but in no acute distress. Vital signs are significant for elevated blood pressure. Oxygen saturation is 100%, which is normal. Head is normocephalic and atraumatic. PERRLA, EOMI. Oropharynx is clear. Neck is nontender and supple without adenopathy or JVD. Back is nontender and there is no CVA tenderness. Lungs are clear without rales, wheezes, or rhonchi.  Chest is mildly tender in the bilateral parasternal area. Heart has regular rate and rhythm without murmur. Abdomen is soft, flat, with moderate epigastric tenderness. There is no rebound or guarding. There are no masses or hepatosplenomegaly and peristalsis is normoactive. Extremities have no cyanosis or edema, full range of motion is present. Skin is warm and dry without rash. Neurologic: Mental status is normal, cranial nerves are intact, there are no motor or sensory deficits.  ED Treatments / Results  Labs (all labs ordered are listed, but only abnormal results are displayed) Labs Reviewed  BASIC METABOLIC PANEL - Abnormal; Notable for the following components:      Result Value    Glucose, Bld 145 (*)    All other components within normal limits  CBC - Abnormal; Notable for the following components:   WBC 12.0 (*)    All other components within normal limits  MAGNESIUM  I-STAT TROPONIN, ED    EKG EKG Interpretation  Date/Time:  Monday October 30 2017 21:06:00 EDT Ventricular Rate:  69 PR Interval:    QRS Duration: 77 QT Interval:  537 QTC Calculation: 576 R Axis:   87 Text Interpretation:  Sinus rhythm Prolonged QT interval When compared with ECG of 05/25/2017, QT has lengthened Confirmed by Dione BoozeGlick, Platon Arocho ([id number]) on 10/30/2017 10:54:37 PM   Radiology Dg Chest 2 View  Result Date: 10/30/2017 CLINICAL DATA:  Central chest pain. EXAM: CHEST - 2 VIEW COMPARISON:  05/25/2017 FINDINGS: The cardiomediastinal contours are normal. Borderline hyperinflation which is unchanged. Pulmonary vasculature is normal. No consolidation, pleural effusion, or pneumothorax. No acute osseous abnormalities are seen. IMPRESSION: No acute abnormalities. Electronically Signed   By: Rubye OaksMelanie  Ehinger M.D.   On: 10/30/2017 21:48    Procedures Procedures  Medications Ordered in ED Medications  gi cocktail (Maalox,Lidocaine,Donnatal) (has no administration in time range)  pantoprazole (PROTONIX) EC tablet 40 mg (has no administration in time range)     Initial Impression / Assessment and Plan / ED Course  I have reviewed the triage vital signs and the nursing notes.  Pertinent labs & imaging results that were available during my care of the patient were reviewed by me and considered in my medical decision making (see chart for details).  Chest pain of uncertain cause.  Old records are reviewed, and he does have past ED visits for chest pain which was felt to be GI in origin.  Initial ECG is significant only for prolonged QT interval, chest x-ray unremarkable, troponin normal.  Labs significant for glucose of 145 which is in the range for prediabetes.  He will be given a GI cocktail and a  dose of pantoprazole.  Heart score is 1, which puts him at low risk for major adverse cardiac events in the next 30 days.  1:24 AM Repeat troponin is normal.  He had no relief with GI cocktail, and vomited following that.  He does seem less restless, but he states he is feeling about the same.  He will be given IV fluids, morphine, ondansetron.  He will need referral to GI for follow-up.  He feels better after above-noted treatment.  He is discharged with prescription for pantoprazole, and small number of oxycodone-acetaminophen tablets.  Advised to stop smoking and avoid alcohol.  Referred to gastroenterology for outpatient follow-up.  Final Clinical Impressions(s) / ED Diagnoses   Final diagnoses:  Atypical chest pain  Epigastric pain    ED Discharge Orders        Ordered    oxyCODONE-acetaminophen (  PERCOCET) 5-325 MG tablet  Every 4 hours PRN     10/31/17 0305    pantoprazole (PROTONIX) 40 MG tablet  Daily     10/31/17 0305       Dione Booze, MD 10/31/17 [phone]

## 2017-10-31 LAB — I-STAT TROPONIN, ED: Troponin i, poc: 0 ng/mL (ref 0.00–0.08)

## 2017-10-31 MED ORDER — MORPHINE SULFATE (PF) 4 MG/ML IV SOLN
4.0000 mg | Freq: Once | INTRAVENOUS | Status: AC
Start: 2017-10-31 — End: 2017-10-31
  Administered 2017-10-31: 4 mg via INTRAVENOUS
  Filled 2017-10-31: qty 1

## 2017-10-31 MED ORDER — SODIUM CHLORIDE 0.9 % IV BOLUS
1000.0000 mL | Freq: Once | INTRAVENOUS | Status: AC
  Administered 2017-10-31: 1000 mL via INTRAVENOUS

## 2017-10-31 MED ORDER — PANTOPRAZOLE SODIUM 40 MG PO TBEC: 40.0000 mg | DELAYED_RELEASE_TABLET | Freq: Every day | ORAL | 0 refills | Status: DC

## 2017-10-31 MED ORDER — ONDANSETRON HCL 4 MG/2ML IJ SOLN
4.0000 mg | Freq: Once | INTRAMUSCULAR | Status: AC
  Administered 2017-10-31: 4 mg via INTRAVENOUS
  Filled 2017-10-31: qty 2

## 2017-10-31 MED ORDER — OXYCODONE-ACETAMINOPHEN 5-325 MG PO TABS: 1.0000 | ORAL_TABLET | ORAL | 0 refills | Status: DC | PRN

## 2017-10-31 NOTE — Discharge Instructions (Addendum)
Here test today did not show any signs of heart problems.  I suspect your pain is from either GERD or peptic ulcer.  In either case, stopping smoking will definitely help.  Do not drink any alcohol.  Please take the pantoprazole once a day.  Is okay to take antacids as needed.  Make an appointment with the gastroenterologist for further evaluation.  Return if symptoms are not being adequately controlled at home.

## 2018-12-06 ENCOUNTER — Other Ambulatory Visit: Payer: Self-pay

## 2018-12-06 ENCOUNTER — Emergency Department (HOSPITAL_COMMUNITY): Payer: Self-pay

## 2018-12-06 ENCOUNTER — Emergency Department (HOSPITAL_COMMUNITY)
Admission: EM | Admit: 2018-12-06 | Discharge: 2018-12-06 | Disposition: A | Discharge status: Home / Self Care | Payer: Self-pay | Attending: Emergency Medicine | Admitting: Emergency Medicine

## 2018-12-06 ENCOUNTER — Encounter (HOSPITAL_COMMUNITY): Payer: Self-pay

## 2018-12-06 DIAGNOSIS — R109 Unspecified abdominal pain: Secondary | ICD-10-CM | POA: Insufficient documentation

## 2018-12-06 DIAGNOSIS — R079 Chest pain, unspecified: Secondary | ICD-10-CM | POA: Insufficient documentation

## 2018-12-06 DIAGNOSIS — Z5321 Procedure and treatment not carried out due to patient leaving prior to being seen by health care provider: Secondary | ICD-10-CM | POA: Insufficient documentation

## 2018-12-06 NOTE — ED Triage Notes (Signed)
Pt arrived via [facility] with complaints of chest pain that started at 2200 last night, pt reports pain worsens when breathing. Per ems, family states that he was seen in May for similar symptoms with no clear diagnosis. Denies NV or SOB.

## 2018-12-06 NOTE — ED Notes (Signed)
Post triage pt had one episode of vomiting, now stating his stomach is cramping.

## 2018-12-06 NOTE — ED Notes (Signed)
Pt came out of his room and stated "I am feeling better. Can I go? My ride is on it's way." This writer encouraged pt to stay but stated "It is ultimately [his] decision." Pt ambulated to the lobby in no apparent distress.

## 2019-03-04 IMAGING — DX DG CHEST 2V
2 series · 2 of 2 positions shown · non-contrast
Comparison: Chest x-ray of January 13, 2017

CLINICAL DATA: Mid chest pain, hit cups. History of CHF, asthma,
diabetes, current smoker.

EXAM:
CHEST  2 VIEW

[chest lat]
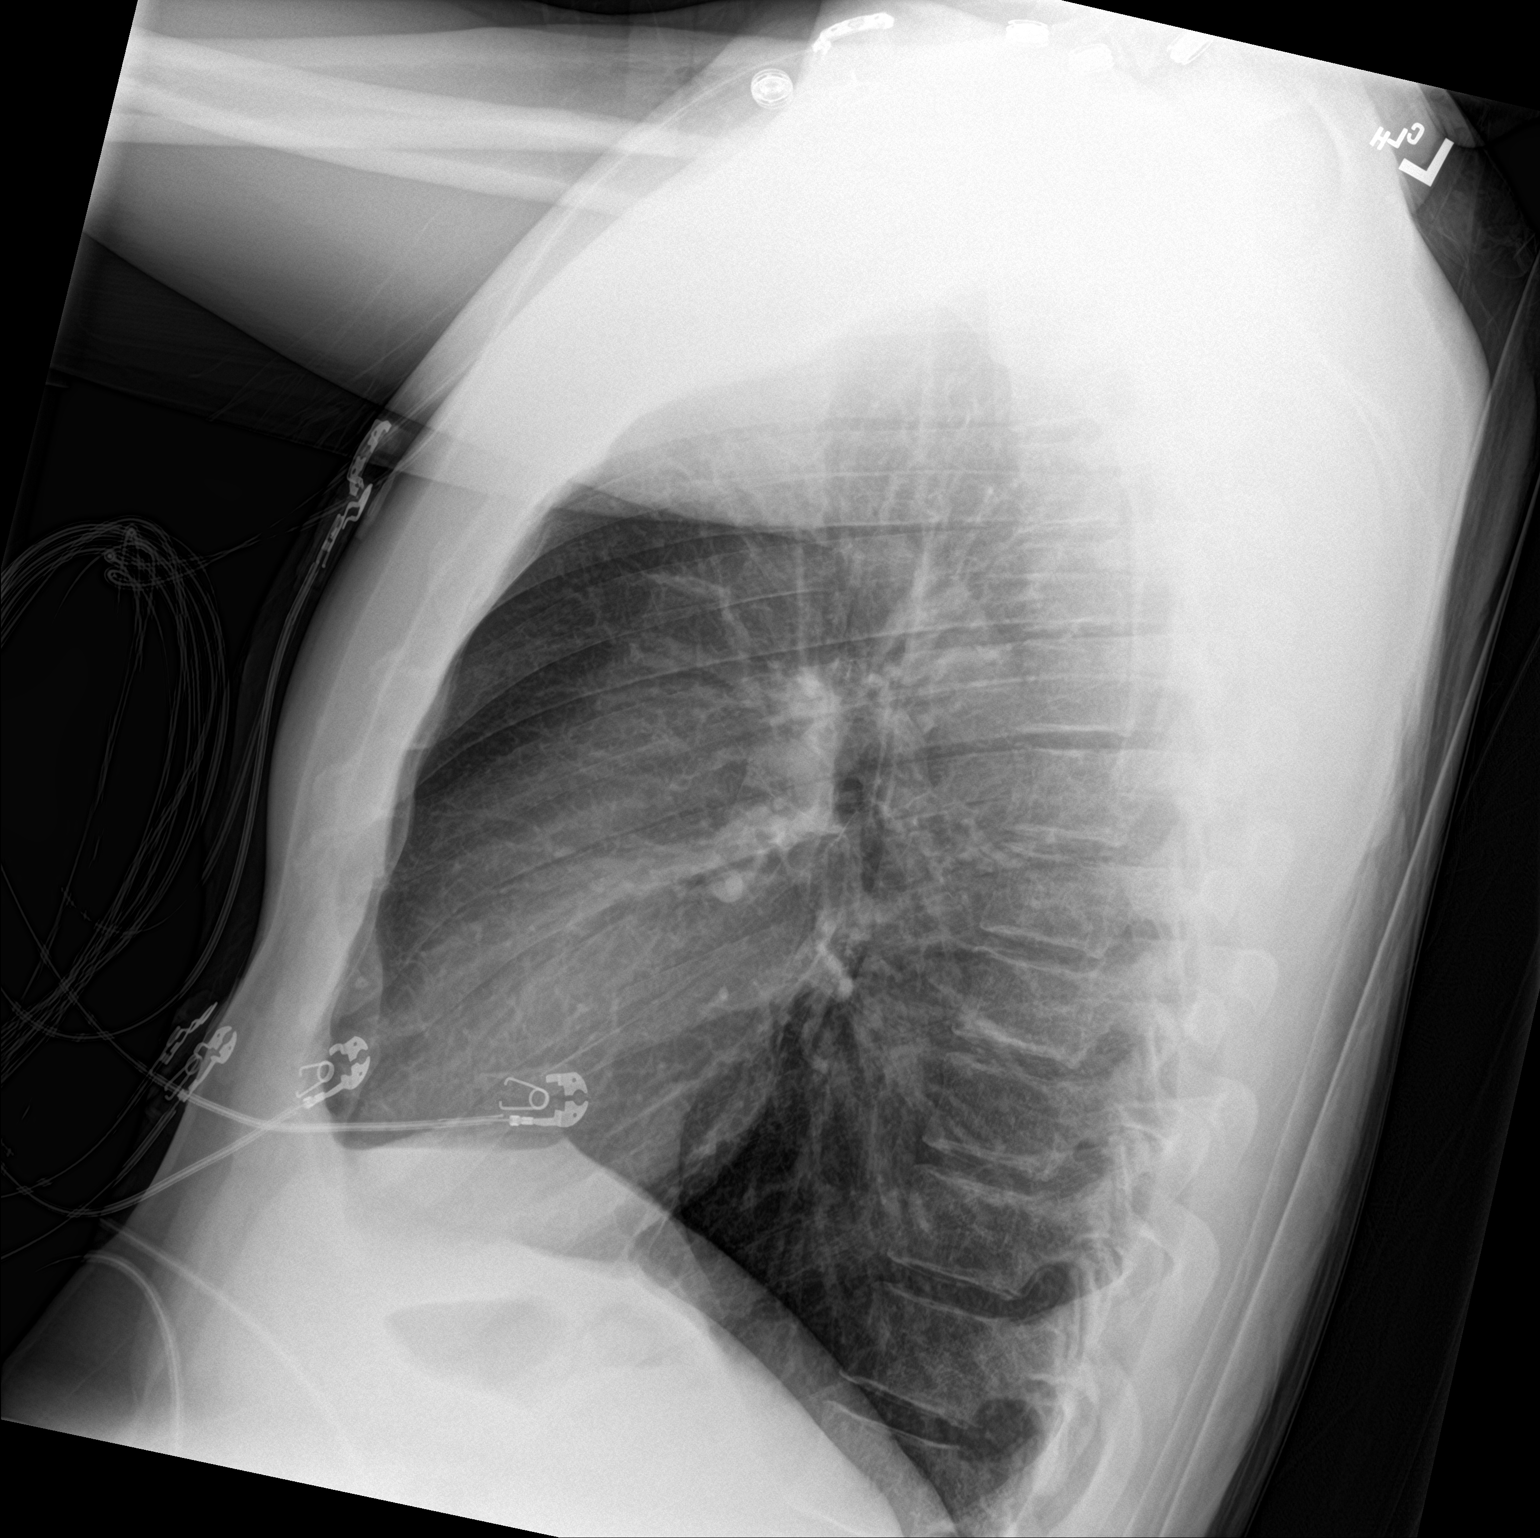

[chest ap]
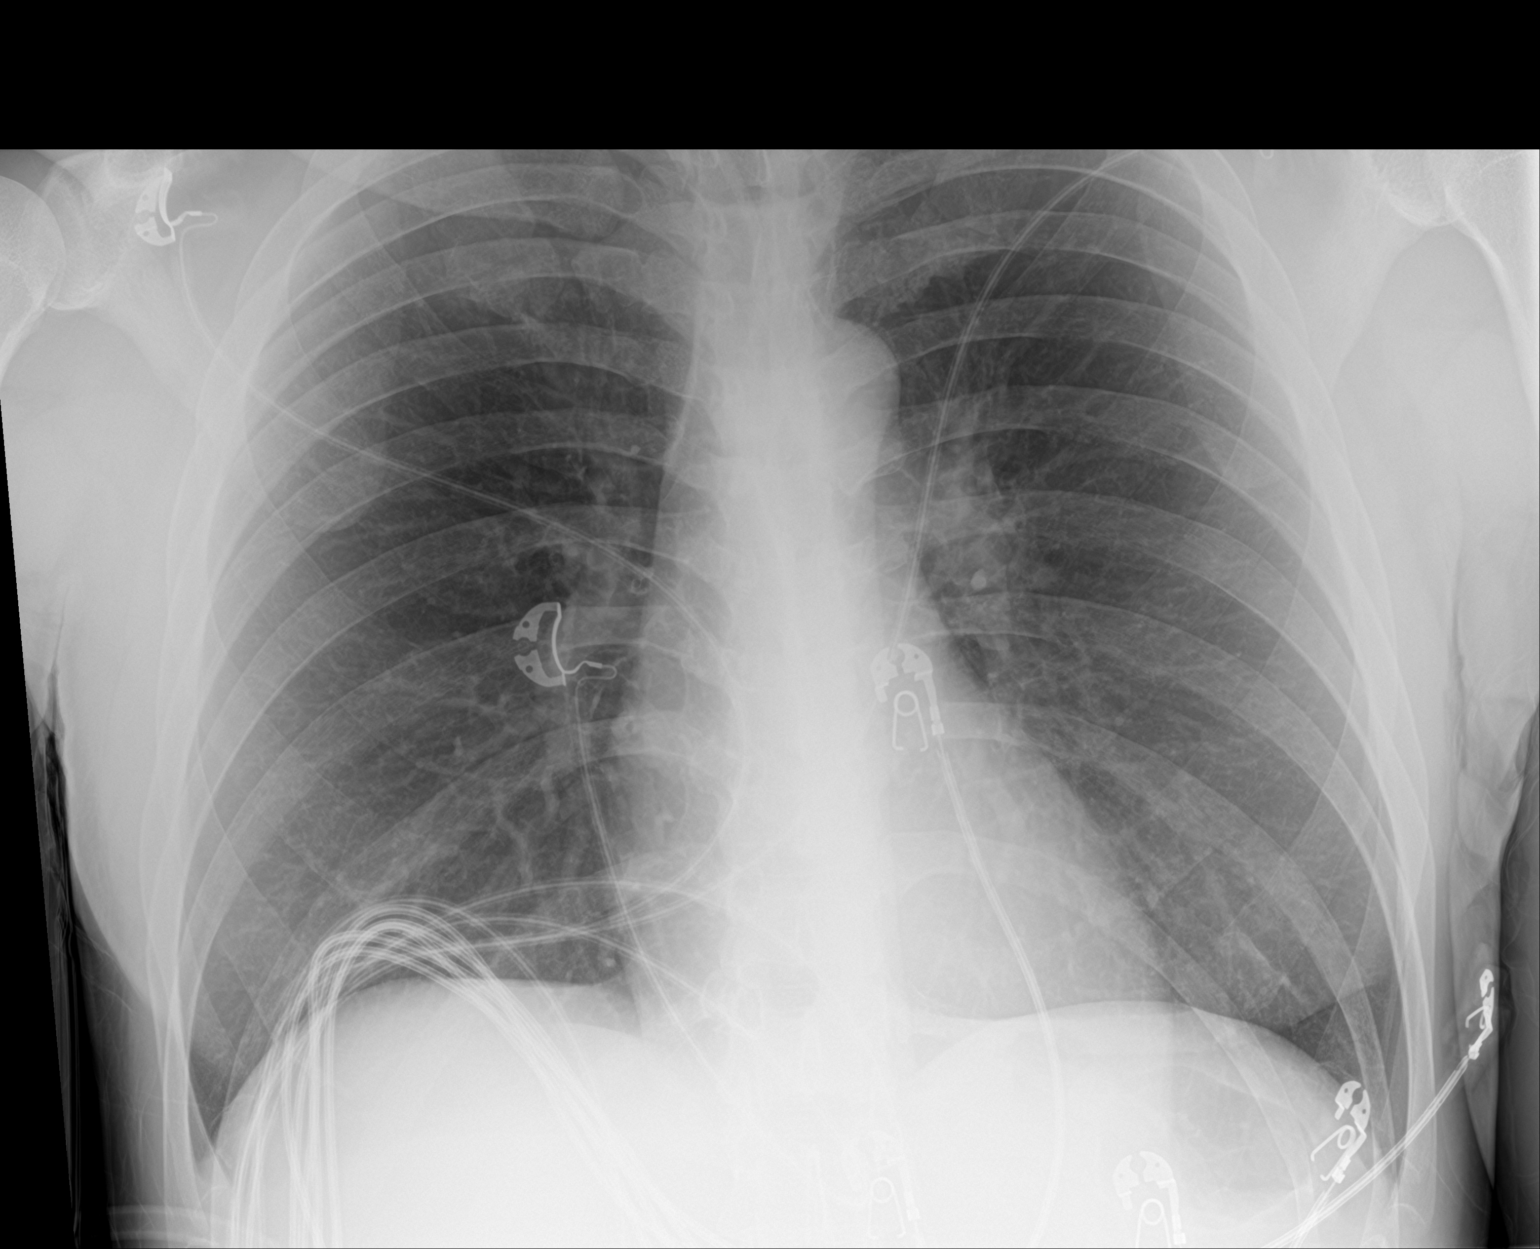

[2 of 2 positions shown; findings below may reference images not displayed]

FINDINGS: The lungs are mildly hyperinflated. There is no focal infiltrate.
There is no pleural effusion or pneumothorax. The heart and
pulmonary vascularity are normal. The retrosternal soft tissues are
normal. There is no acute bony abnormality.
IMPRESSION: Hyperinflation consistent with reactive airway disease or COPD. No
CHF, pneumonia, nor other acute cardiopulmonary abnormality peer

## 2019-04-13 ENCOUNTER — Emergency Department (HOSPITAL_COMMUNITY): Payer: 59

## 2019-04-13 ENCOUNTER — Emergency Department (HOSPITAL_COMMUNITY)
Admission: EM | Admit: 2019-04-13 | Discharge: 2019-04-13 | Disposition: A | Discharge status: Home / Self Care | Payer: 59 | Attending: Emergency Medicine | Admitting: Emergency Medicine

## 2019-04-13 ENCOUNTER — Other Ambulatory Visit: Payer: Self-pay

## 2019-04-13 ENCOUNTER — Encounter (HOSPITAL_COMMUNITY): Payer: Self-pay | Admitting: Emergency Medicine

## 2019-04-13 DIAGNOSIS — K2100 Gastro-esophageal reflux disease with esophagitis, without bleeding: Secondary | ICD-10-CM | POA: Insufficient documentation

## 2019-04-13 DIAGNOSIS — R079 Chest pain, unspecified: Secondary | ICD-10-CM | POA: Diagnosis present

## 2019-04-13 DIAGNOSIS — I10 Essential (primary) hypertension: Secondary | ICD-10-CM | POA: Diagnosis not present

## 2019-04-13 DIAGNOSIS — F17210 Nicotine dependence, cigarettes, uncomplicated: Secondary | ICD-10-CM | POA: Insufficient documentation

## 2019-04-13 DIAGNOSIS — Z79899 Other long term (current) drug therapy: Secondary | ICD-10-CM | POA: Diagnosis not present

## 2019-04-13 LAB — BASIC METABOLIC PANEL
Anion gap: 15 (ref 5–15)
BUN: 11 mg/dL (ref 6–20)
CO2: 21 mmol/L — ABNORMAL LOW (ref 22–32)
Calcium: 9.9 mg/dL (ref 8.9–10.3)
Chloride: 108 mmol/L (ref 98–111)
Creatinine, Ser: 1.09 mg/dL (ref 0.61–1.24)
GFR calc Af Amer: >60 mL/min (ref >60)
GFR calc non Af Amer: >60 mL/min (ref >60)
Glucose, Bld: 153 mg/dL — ABNORMAL HIGH (ref 70–99)
Potassium: 3.9 mmol/L (ref 3.5–5.1)
Sodium: 144 mmol/L (ref 135–145)

## 2019-04-13 LAB — CBC
HCT: 47.5 % (ref 39.0–52.0)
Hemoglobin: 16.3 g/dL (ref 13.0–17.0)
MCH: 33.1 pg (ref 26.0–34.0)
MCHC: 34.3 g/dL (ref 30.0–36.0)
MCV: 96.3 fL (ref 80.0–100.0)
Platelets: 301 10*3/uL (ref 150–400)
RBC: 4.93 MIL/uL (ref 4.22–5.81)
RDW: 12.7 % (ref 11.5–15.5)
WBC: 11.2 10*3/uL — ABNORMAL HIGH (ref 4.0–10.5)
nRBC: 0 % (ref 0.0–0.2)

## 2019-04-13 LAB — OCCULT BLOOD GASTRIC / DUODENUM (SPECIMEN CUP): Occult Blood, Gastric: POSITIVE — AB

## 2019-04-13 LAB — TROPONIN I (HIGH SENSITIVITY)
Troponin I (High Sensitivity): <2 ng/L (ref <18)
Troponin I (High Sensitivity): <2 ng/L (ref <18)

## 2019-04-13 MED ORDER — SODIUM CHLORIDE 0.9% FLUSH: 3.0000 mL | Freq: Once | INTRAVENOUS | Status: DC

## 2019-04-13 MED ORDER — PANTOPRAZOLE SODIUM 40 MG PO TBEC
40.0000 mg | DELAYED_RELEASE_TABLET | Freq: Once | ORAL | Status: AC
  Administered 2019-04-13: 40 mg via ORAL
  Filled 2019-04-13: qty 1

## 2019-04-13 MED ORDER — PANTOPRAZOLE SODIUM 20 MG PO TBEC: 20.0000 mg | DELAYED_RELEASE_TABLET | Freq: Every day | ORAL | 1 refills | Status: AC

## 2019-04-13 MED ORDER — LIDOCAINE VISCOUS HCL 2 % MT SOLN
15.0000 mL | Freq: Once | OROMUCOSAL | Status: AC
  Administered 2019-04-13: 15 mL via ORAL
  Filled 2019-04-13: qty 15

## 2019-04-13 MED ORDER — ONDANSETRON HCL 4 MG/2ML IJ SOLN
4.0000 mg | Freq: Once | INTRAMUSCULAR | Status: AC
  Administered 2019-04-13: 16:00:00 4 mg via INTRAVENOUS
  Filled 2019-04-13: qty 2

## 2019-04-13 MED ORDER — ONDANSETRON 4 MG PO TBDP: 4.0000 mg | ORAL_TABLET | Freq: Three times a day (TID) | ORAL | 0 refills | Status: AC | PRN

## 2019-04-13 MED ORDER — ALUM & MAG HYDROXIDE-SIMETH 200-200-20 MG/5ML PO SUSP
30.0000 mL | Freq: Once | ORAL | Status: AC
  Administered 2019-04-13: 30 mL via ORAL
  Filled 2019-04-13: qty 30

## 2019-04-13 MED ORDER — SODIUM CHLORIDE 0.9 % IV BOLUS
1000.0000 mL | Freq: Once | INTRAVENOUS | Status: AC
  Administered 2019-04-13: 1000 mL via INTRAVENOUS

## 2019-04-13 MED ORDER — HYDROMORPHONE HCL 1 MG/ML IJ SOLN
1.0000 mg | Freq: Once | INTRAMUSCULAR | Status: AC
  Administered 2019-04-13: 14:00:00 1 mg via INTRAVENOUS
  Filled 2019-04-13: qty 1

## 2019-04-13 MED ORDER — FENTANYL CITRATE (PF) 100 MCG/2ML IJ SOLN
50.0000 ug | Freq: Once | INTRAMUSCULAR | Status: AC
  Administered 2019-04-13: 16:00:00 50 ug via INTRAVENOUS
  Filled 2019-04-13: qty 2

## 2019-04-13 MED ORDER — ONDANSETRON HCL 4 MG/2ML IJ SOLN
4.0000 mg | Freq: Once | INTRAMUSCULAR | Status: AC
  Administered 2019-04-13: 4 mg via INTRAVENOUS
  Filled 2019-04-13: qty 2

## 2019-04-13 NOTE — ED Notes (Signed)
Got patient on the monitor into a gown patient is resting with call bell in reach  

## 2019-04-13 NOTE — ED Triage Notes (Signed)
C/o sharp pain to center of chest and vomiting that woke him up at 5am.  Took Tums without relief.  Also reports R arm numbness.  No arm drift noted.

## 2019-04-13 NOTE — Discharge Instructions (Signed)
Your testing was overall unremarkable, there was some blood in your vomit which occurs when you vomit repeatedly   Please take Protonix daily for the next 30 days, Please avoid the use of anti-inflammatories like ibuprofen Advil or Aleve as that will make this worse Please avoid drinking any alcohol as that will make this worse  Please have a clear liquid diet for the next 24 hours then gradually progress your diet.  Zofran as needed for nausea

## 2019-04-13 NOTE — ED Provider Notes (Signed)
[facility] EMERGENCY DEPARTMENT Provider Note   CSN: [id number] Arrival date & time: 04/13/19  1212     History   Chief Complaint Chief Complaint  Patient presents with  . Chest Pain    HPI Kyle Pratt is a 38 y.o. male.     HPI  This patient is a 38 year old male, known history of acid reflux states that he takes daily Protonix and denies the use of alcohol or anti-inflammatory medications.  Review of the medical record shows that this patient has been evaluated in the emergency department multiple times in the past for chest pain, atypical chest pain and acid reflux, states that he woke up at 5:00 AM with acute onset of pain in his chest which made him vomit, it has been persistent since it did not get better with over-the-counter Tums prior to arrival.  He reports that this pain is similar to what he has had in the past, and does have some radiation of the back.  He does not have any blood in his stools or changes in his bowel habits.  He has had a normal appetite and last night when he went to sleep he felt his normal self.  He does report that he feels the same today as he did at the prior times that he had abdominal and chest discomfort.This patient is a 38 year old male it is a sharp and stabbing it is a sharp and stabbing and sometimes burning discomfort.  Past Medical History:  Diagnosis Date  . GERD (gastroesophageal reflux disease)   . Hypertension     There are no active problems to display for this patient.   History reviewed. No pertinent surgical history.      Home Medications    Prior to Admission medications   Medication Sig Start Date End Date Taking? Authorizing Provider  alum & mag hydroxide-simeth (MAALOX/MYLANTA) 200-200-20 MG/5ML suspension Take 15 mLs by mouth every 6 (six) hours as needed for indigestion or heartburn.   Yes [provider]  calcium carbonate (TUMS - DOSED IN MG ELEMENTAL CALCIUM) 500 MG chewable  tablet Chew 2 tablets by mouth daily as needed for indigestion or heartburn.   Yes [provider]  ondansetron (ZOFRAN ODT) 4 MG disintegrating tablet Take 1 tablet (4 mg total) by mouth every 8 (eight) hours as needed for nausea. 04/13/19   Eber Hong, MD  pantoprazole (PROTONIX) 20 MG tablet Take 1 tablet (20 mg total) by mouth daily. 04/13/19   Eber Hong, MD    Family History No family history on file.  Social History Social History   Tobacco Use  . Smoking status: Current Every Day Smoker    Packs/day: 1.00    Types: Cigarettes  . Smokeless tobacco: Never Used  Substance Use Topics  . Alcohol use: Yes    Comment: occasionally  . Drug use: Yes    Types: Marijuana     Allergies   Patient has no known allergies.   Review of Systems Review of Systems  All other systems reviewed and are negative.    Physical Exam Updated Vital Signs BP (!) 153/93   Pulse (!) 38   Temp 98.7 F (37.1 C) (Oral)   Resp 14   Ht 1.753 m (5\' 9" )   Wt 83.9 kg   SpO2 98%   BMI 27.32 kg/m   Physical Exam Vitals signs and nursing note reviewed.  Constitutional:      General: He is not in acute distress.  Appearance: He is well-developed.  HENT:     Head: Normocephalic and atraumatic.     Mouth/Throat:     Pharynx: No oropharyngeal exudate.  Eyes:     General: No scleral icterus.       Right eye: No discharge.        Left eye: No discharge.     Conjunctiva/sclera: Conjunctivae normal.     Pupils: Pupils are equal, round, and reactive to light.  Neck:     Musculoskeletal: Normal range of motion and neck supple.     Thyroid: No thyromegaly.     Vascular: No JVD.  Cardiovascular:     Rate and Rhythm: Normal rate and regular rhythm.     Heart sounds: Normal heart sounds. No murmur. No friction rub. No gallop.   Pulmonary:     Effort: Pulmonary effort is normal. No respiratory distress.     Breath sounds: Normal breath sounds. No wheezing or rales.  Chest:      Chest wall: No tenderness.  Abdominal:     General: Bowel sounds are normal. There is no distension.     Palpations: Abdomen is soft. There is no mass.     Tenderness: There is abdominal tenderness.     Comments: Mild epigastric tenderness, no guarding  Musculoskeletal: Normal range of motion.        General: No tenderness.  Lymphadenopathy:     Cervical: No cervical adenopathy.  Skin:    General: Skin is warm and dry.     Findings: No erythema or rash.  Neurological:     Mental Status: He is alert.     Coordination: Coordination normal.  Psychiatric:        Behavior: Behavior normal.      ED Treatments / Results  Labs (all labs ordered are listed, but only abnormal results are displayed) Labs Reviewed  BASIC METABOLIC PANEL - Abnormal; Notable for the following components:      Result Value   CO2 21 (*)    Glucose, Bld 153 (*)    All other components within normal limits  CBC - Abnormal; Notable for the following components:   WBC 11.2 (*)    All other components within normal limits  OCCULT BLOOD GASTRIC / DUODENUM (SPECIMEN CUP) - Abnormal; Notable for the following components:   Occult Blood, Gastric POSITIVE (*)    All other components within normal limits  TROPONIN I (HIGH SENSITIVITY)  TROPONIN I (HIGH SENSITIVITY)    EKG EKG Interpretation  Date/Time:  Saturday April 13 2019 12:16:47 EST Ventricular Rate:  66 PR Interval:  128 QRS Duration: 96 QT Interval:  388 QTC Calculation: 406 R Axis:   88 Text Interpretation: Normal sinus rhythm Normal ECG since last tracing no significant change Confirmed by Noemi Chapel [phone]) on 04/13/2019 1:31:25 PM   Radiology Dg Chest 2 View  Result Date: 04/13/2019 CLINICAL DATA:  Central chest pain and vomiting since this morning. Smoker. EXAM: CHEST - 2 VIEW COMPARISON:  None. FINDINGS: Normal sized heart. Clear lungs. The lungs are mildly hyperexpanded with mild diffuse peribronchial thickening. Unremarkable  bones. IMPRESSION: Mild changes of COPD and chronic bronchitis. No acute abnormality. Electronically Signed   By: Claudie Revering M.D.   On: 04/13/2019 12:39    Procedures Procedures (including critical care time)  Medications Ordered in ED Medications  alum & mag hydroxide-simeth (MAALOX/MYLANTA) 200-200-20 MG/5ML suspension 30 mL (30 mLs Oral Given 04/13/19 1352)    And  lidocaine (XYLOCAINE) 2 %  viscous mouth solution 15 mL (15 mLs Oral Given 04/13/19 1352)  HYDROmorphone (DILAUDID) injection 1 mg (1 mg Intravenous Given 04/13/19 1351)  ondansetron (ZOFRAN) injection 4 mg (4 mg Intravenous Given 04/13/19 1351)  sodium chloride 0.9 % bolus 1,000 mL (0 mLs Intravenous Stopped 04/13/19 1547)  fentaNYL (SUBLIMAZE) injection 50 mcg (50 mcg Intravenous Given 04/13/19 1544)  ondansetron (ZOFRAN) injection 4 mg (4 mg Intravenous Given 04/13/19 1545)  pantoprazole (PROTONIX) EC tablet 40 mg (40 mg Oral Given 04/13/19 1547)     Initial Impression / Assessment and Plan / ED Course  I have reviewed the triage vital signs and the nursing notes.  Pertinent labs & imaging results that were available during my care of the patient were reviewed by me and considered in my medical decision making (see chart for details).  Clinical Course as of Apr 13 724  Sat Apr 13, 2019  1509 I personally looked at the x-ray, there is no findings of pneumothorax or pneumomediastinum, labs are unremarkable, no anemia, Gastroccult is positive not surprising given the retching   [BM]    Clinical Course User Index [BM] Eber HongMiller, Mallissa Lorenzen, MD       The patient's chest x-ray is unremarkable, I have personally viewed the images and there is no signs of any findings of pneumothorax, pneumomediastinum or other abnormal findings.  His cardiac exam is unremarkable both to auscultation and EKG.  His labs are unremarkable, his vital signs are normal.  He will be given a GI cocktail with some pain medication, some IV fluids and  likely discharged.  I do not think he needs a more aggressive work-up as he has had this multiple times in the past.  In the end the patient requested and last dose of medication prior to discharge.  He appeared much more comfortable on repeat exam, given proton pump inhibitor, antiemetic and a small dose of fentanyl prior to discharge.  The patient understands the indications for return  Final Clinical Impressions(s) / ED Diagnoses   Final diagnoses:  Gastroesophageal reflux disease with esophagitis, unspecified whether hemorrhage    ED Discharge Orders         Ordered    pantoprazole (PROTONIX) 20 MG tablet  Daily     04/13/19 1537    ondansetron (ZOFRAN ODT) 4 MG disintegrating tablet  Every 8 hours PRN     04/13/19 1538           Eber HongMiller, Edith Lord, MD 04/14/19 0725

## 2019-08-23 ENCOUNTER — Ambulatory Visit: Payer: 59 | Attending: Internal Medicine

## 2019-08-23 DIAGNOSIS — Z23 Encounter for immunization: Secondary | ICD-10-CM

## 2019-08-23 NOTE — Progress Notes (Signed)
   Covid-19 Vaccination Clinic  Name:  Kyle Pratt    MRN: [id number] DOB: Jun 09, 1980  08/23/2019  Mr. [name] was observed post Covid-19 immunization for 15 minutes without incident. He was provided with Vaccine Information Sheet and instruction to access the V-Safe system.   Mr. [name] was instructed to call 911 with any severe reactions post vaccine: Marland Kitchen Difficulty breathing  . Swelling of face and throat  . A fast heartbeat  . A bad rash all over body  . Dizziness and weakness   Immunizations Administered    Name Date Dose VIS Date Route   Pfizer COVID-19 Vaccine 08/23/2019  3:55 PM 0.3 mL 05/10/2019 Intramuscular   Manufacturer: [employer], [employer]   Lot: [id number]   NDC: [address]-6

## 2019-09-16 ENCOUNTER — Ambulatory Visit: Payer: 59 | Attending: Internal Medicine

## 2019-09-16 ENCOUNTER — Ambulatory Visit: Payer: 59

## 2019-09-16 DIAGNOSIS — Z23 Encounter for immunization: Secondary | ICD-10-CM

## 2019-09-16 NOTE — Progress Notes (Signed)
   Covid-19 Vaccination Clinic  Name:  Kyle Pratt    MRN: [id number] DOB: 04-21-1981  09/16/2019  [name] was observed post Covid-19 immunization for 15 minutes without incident. He was provided with Vaccine Information Sheet and instruction to access the V-Safe system.   [name] was instructed to call 911 with any severe reactions post vaccine: Marland Kitchen Difficulty breathing  . Swelling of face and throat  . A fast heartbeat  . A bad rash all over body  . Dizziness and weakness   Immunizations Administered    Name Date Dose VIS Date Route   Pfizer COVID-19 Vaccine 09/16/2019  9:29 AM 0.3 mL 07/24/2018 Intramuscular   Manufacturer: [employer], [employer]   Lot: [id number]   NDC: [address]-1

## 2021-01-20 IMAGING — DX DG CHEST 2V
2 series · 2 of 2 positions shown · non-contrast
Comparison: None.

CLINICAL DATA: Central chest pain and vomiting since this morning.
Smoker.

EXAM:
CHEST - 2 VIEW

[chest pa]
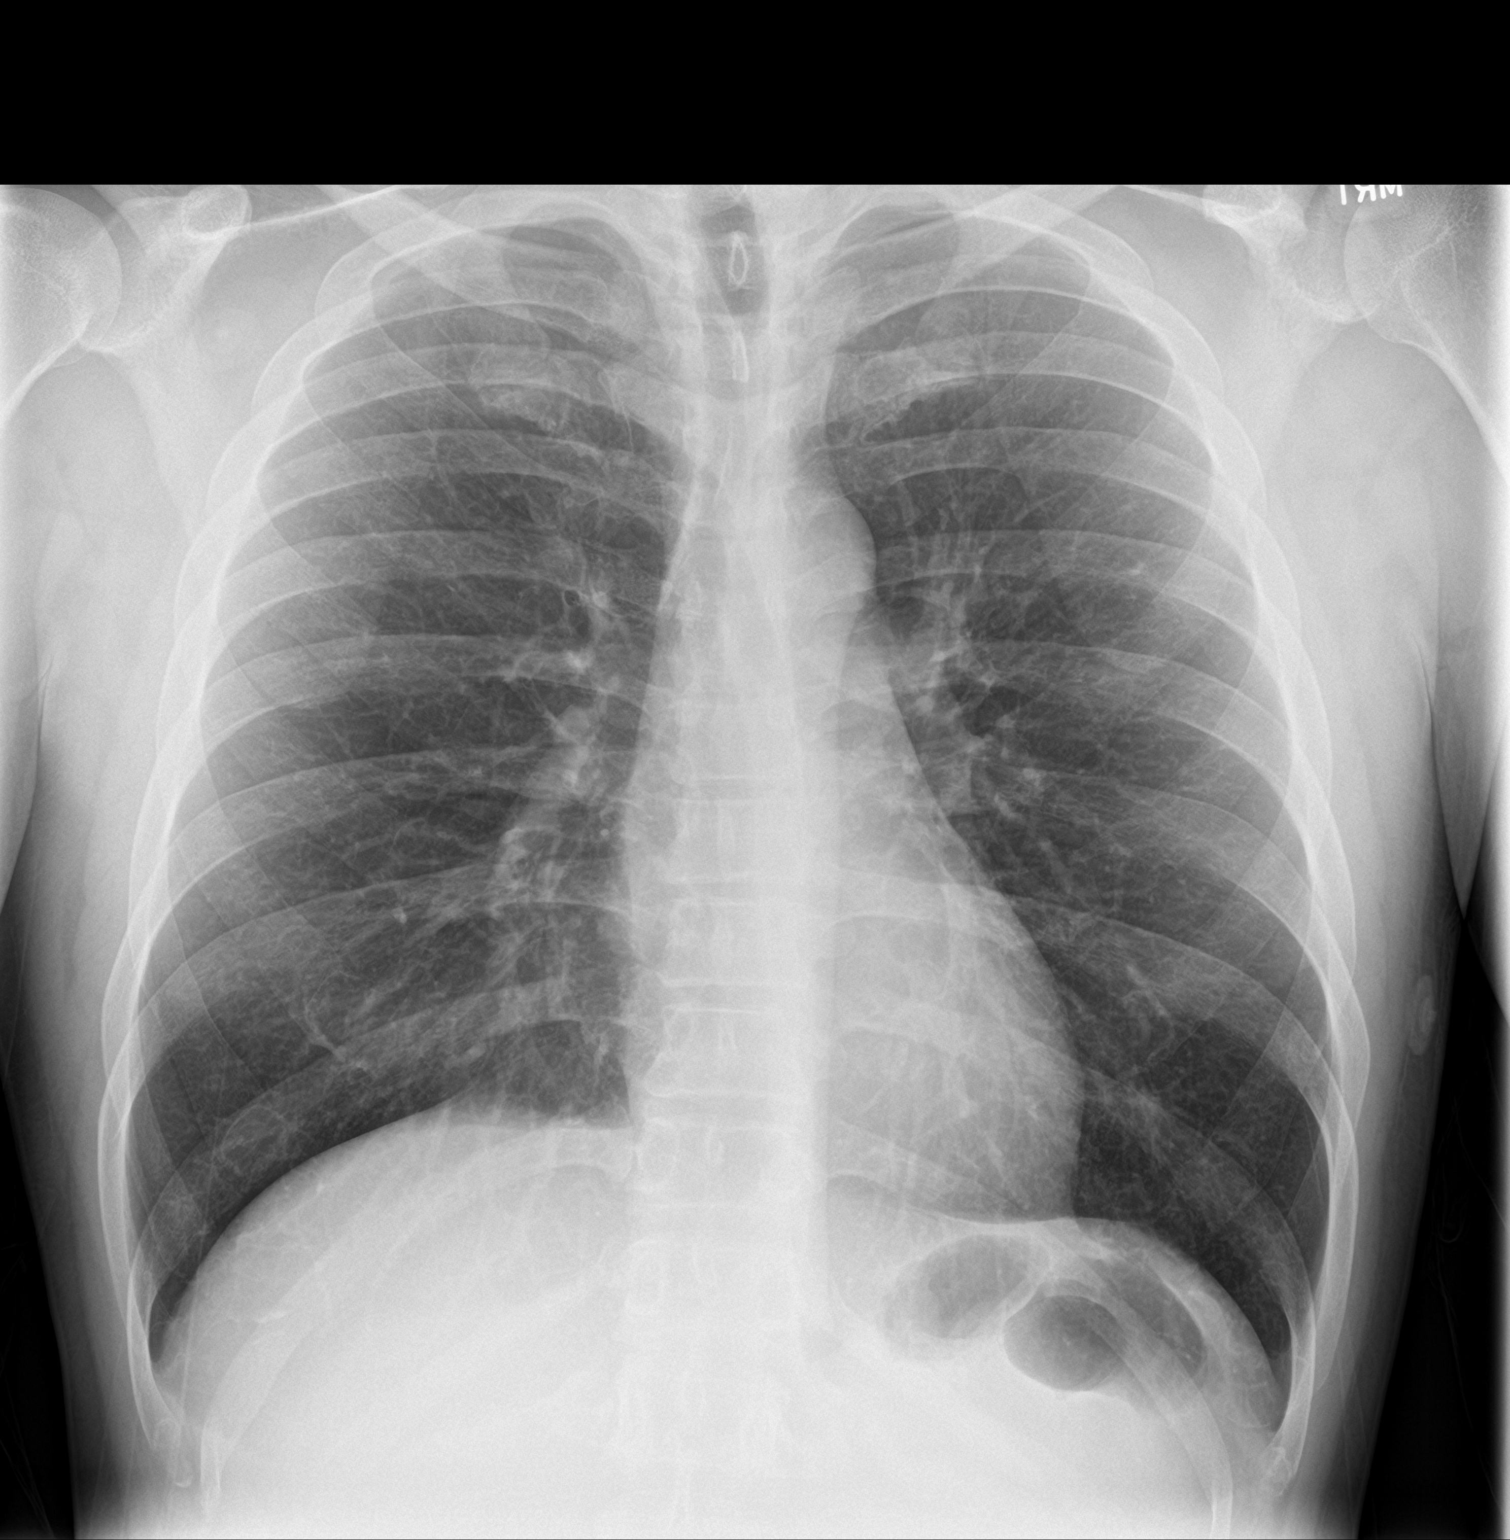

[chest lat]
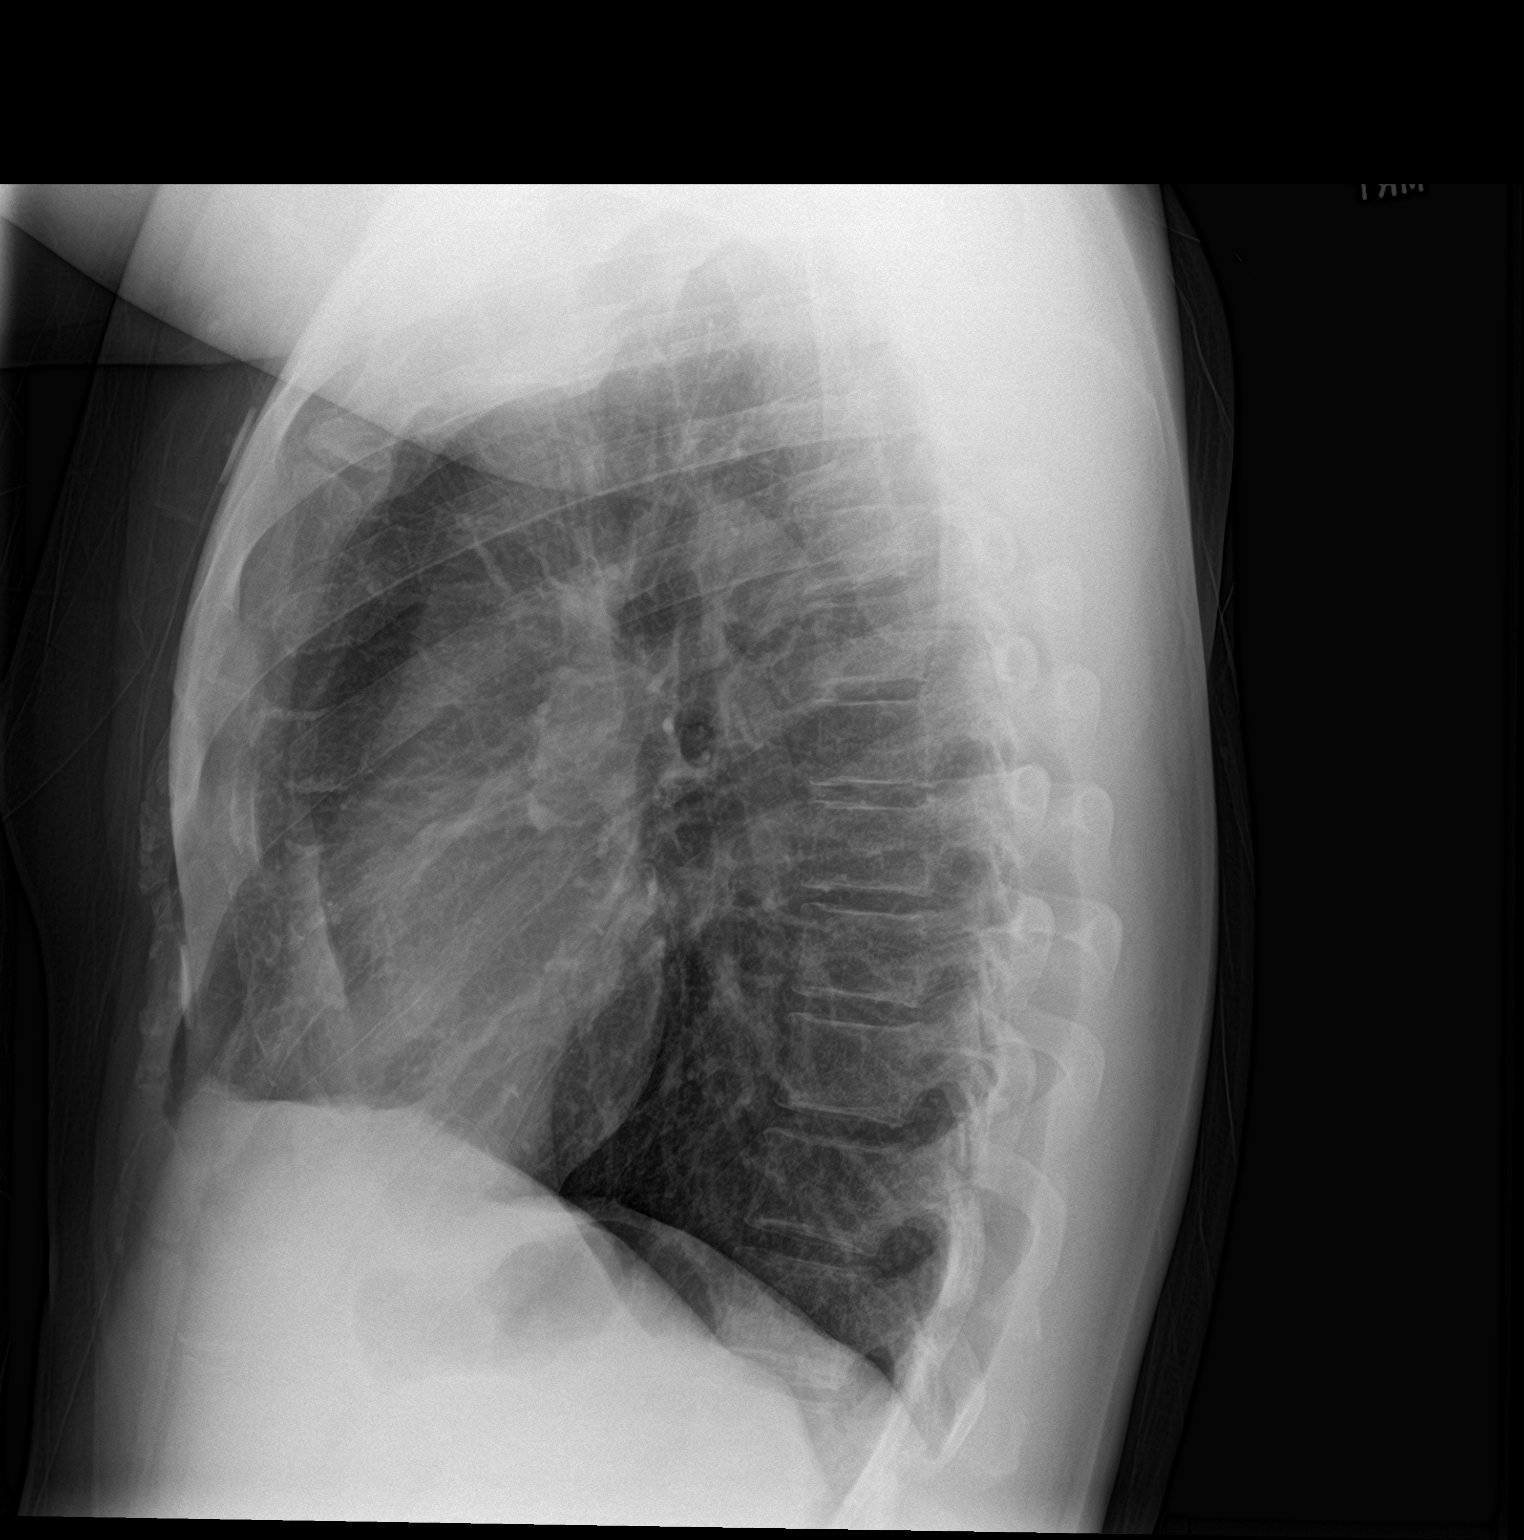

[2 of 2 positions shown; findings below may reference images not displayed]

FINDINGS: Normal sized heart. Clear lungs. The lungs are mildly hyperexpanded
with mild diffuse peribronchial thickening. Unremarkable bones.
IMPRESSION: Mild changes of COPD and chronic bronchitis. No acute abnormality.

## 2023-10-24 ENCOUNTER — Encounter (HOSPITAL_COMMUNITY): Payer: Self-pay

## 2023-10-24 ENCOUNTER — Emergency Department (HOSPITAL_COMMUNITY)
Admission: EM | Admit: 2023-10-24 | Discharge: 2023-10-24 | Discharge status: Against Medical Advice | Payer: Self-pay | Attending: Emergency Medicine | Admitting: Emergency Medicine

## 2023-10-24 ENCOUNTER — Emergency Department (HOSPITAL_COMMUNITY): Payer: Self-pay

## 2023-10-24 ENCOUNTER — Other Ambulatory Visit: Payer: Self-pay

## 2023-10-24 DIAGNOSIS — R61 Generalized hyperhidrosis: Secondary | ICD-10-CM | POA: Diagnosis not present

## 2023-10-24 DIAGNOSIS — R112 Nausea with vomiting, unspecified: Secondary | ICD-10-CM | POA: Insufficient documentation

## 2023-10-24 DIAGNOSIS — Z5321 Procedure and treatment not carried out due to patient leaving prior to being seen by health care provider: Secondary | ICD-10-CM | POA: Insufficient documentation

## 2023-10-24 DIAGNOSIS — R0602 Shortness of breath: Secondary | ICD-10-CM | POA: Diagnosis not present

## 2023-10-24 DIAGNOSIS — R0789 Other chest pain: Secondary | ICD-10-CM | POA: Insufficient documentation

## 2023-10-24 LAB — COMPREHENSIVE METABOLIC PANEL WITH GFR
ALT: 30 U/L (ref 0–44)
AST: 29 U/L (ref 15–41)
Albumin: 4.5 g/dL (ref 3.5–5.0)
Alkaline Phosphatase: 44 U/L (ref 38–126)
Anion gap: 12 (ref 5–15)
BUN: 7 mg/dL (ref 6–20)
CO2: 23 mmol/L (ref 22–32)
Calcium: 9.2 mg/dL (ref 8.9–10.3)
Chloride: 108 mmol/L (ref 98–111)
Creatinine, Ser: 1.03 mg/dL (ref 0.61–1.24)
GFR, Estimated: >60 mL/min (ref >60)
Glucose, Bld: 125 mg/dL — ABNORMAL HIGH (ref 70–99)
Potassium: 4.3 mmol/L (ref 3.5–5.1)
Sodium: 143 mmol/L (ref 135–145)
Total Bilirubin: 0.9 mg/dL (ref 0.0–1.2)
Total Protein: 7.6 g/dL (ref 6.5–8.1)

## 2023-10-24 LAB — LIPASE, BLOOD: Lipase: 23 U/L (ref 11–51)

## 2023-10-24 LAB — TROPONIN I (HIGH SENSITIVITY): Troponin I (High Sensitivity): 2 ng/L (ref <18)

## 2023-10-24 MED ORDER — ALUM & MAG HYDROXIDE-SIMETH 200-200-20 MG/5ML PO SUSP
30.0000 mL | Freq: Once | ORAL | Status: AC
  Administered 2023-10-24: 30 mL via ORAL
  Filled 2023-10-24: qty 30

## 2023-10-24 NOTE — ED Notes (Signed)
 Pt stated that he wanted to leave because it is taking too long to get to a room and he just wanted to go home, I warned pt about the possible consequences of leaving and encouraged him to stay but he still decided that he wanted to leave. I removed IV and pt left at 1500.

## 2023-10-24 NOTE — ED Notes (Signed)
 MSE waiver signed.

## 2023-10-24 NOTE — ED Triage Notes (Signed)
 Pt to ED via [facility] from home. Pt has mid-left sided chest pain. EMS reports pt being diaphoretic when they arrived. Pain started approximately 2hrs ago. Pt has had nausea. Pt alert on arrival, appears to be in pain. Pt c/o mid-sternal chest pain, nausea, vomiting, and shortness of breath on arrival.   20g RAC Zofran  4mg   Fentanyl  163/67 HR 54 100% RA Cbg 166

## 2023-10-24 NOTE — ED Notes (Signed)
 Pt to xray

## 2023-10-24 NOTE — ED Provider Triage Note (Signed)
 Emergency Medicine Provider Triage Evaluation Note  Kyle Pratt , a 43 y.o. male  was evaluated in triage.  Pt complains of chest pain in the center of her chest, woke up with this this morning.  Is nonradiating.  No shortness of breath.  No cough.  No vomiting  Review of Systems  Positive: Chest pain Negative: Fever, vomiting, shortness of breath  Physical Exam  Ht 5\' 9"  (1.753 m)   Wt 81.6 kg   BMI 26.58 kg/m  Gen:   Awake, no distress    Resp:  Normal effort   MSK:   Moves extremities without difficulty   Other:     Medical Decision Making  Medically screening exam initiated at 12:49 PM.  Appropriate orders placed.  Gaetana Jones was informed that the remainder of the evaluation will be completed by another provider, this initial triage assessment does not replace that evaluation, and the importance of remaining in the ED until their evaluation is complete.      Hershel Los, MD 10/24/23 1250
